# Patient Record
Sex: Female | Born: 1970 | Race: Black or African American | Hispanic: No | Marital: Married | Smoking: Never smoker
Health system: Southern US, Community
[De-identification: ages and names within clinical notes are randomized; demographics above are authoritative.]

## PROBLEM LIST (undated history)

## (undated) DIAGNOSIS — I219 Acute myocardial infarction, unspecified: Secondary | ICD-10-CM

## (undated) DIAGNOSIS — M810 Age-related osteoporosis without current pathological fracture: Secondary | ICD-10-CM

## (undated) DIAGNOSIS — H269 Unspecified cataract: Secondary | ICD-10-CM

## (undated) DIAGNOSIS — E7800 Pure hypercholesterolemia, unspecified: Secondary | ICD-10-CM

## (undated) DIAGNOSIS — N189 Chronic kidney disease, unspecified: Secondary | ICD-10-CM

## (undated) DIAGNOSIS — F32A Depression, unspecified: Secondary | ICD-10-CM

## (undated) DIAGNOSIS — M1990 Unspecified osteoarthritis, unspecified site: Secondary | ICD-10-CM

## (undated) DIAGNOSIS — G43909 Migraine, unspecified, not intractable, without status migrainosus: Secondary | ICD-10-CM

## (undated) DIAGNOSIS — G709 Myoneural disorder, unspecified: Secondary | ICD-10-CM

## (undated) DIAGNOSIS — E079 Disorder of thyroid, unspecified: Secondary | ICD-10-CM

## (undated) DIAGNOSIS — F419 Anxiety disorder, unspecified: Secondary | ICD-10-CM

## (undated) DIAGNOSIS — D649 Anemia, unspecified: Secondary | ICD-10-CM

## (undated) DIAGNOSIS — G4730 Sleep apnea, unspecified: Secondary | ICD-10-CM

## (undated) DIAGNOSIS — G4733 Obstructive sleep apnea (adult) (pediatric): Secondary | ICD-10-CM

## (undated) DIAGNOSIS — F329 Major depressive disorder, single episode, unspecified: Secondary | ICD-10-CM

## (undated) DIAGNOSIS — I10 Essential (primary) hypertension: Secondary | ICD-10-CM

## (undated) HISTORY — DX: Acute myocardial infarction, unspecified: I21.9

## (undated) HISTORY — DX: Disorder of thyroid, unspecified: E07.9

## (undated) HISTORY — DX: Sleep apnea, unspecified: G47.30

## (undated) HISTORY — PX: HERNIA REPAIR: SHX51

## (undated) HISTORY — DX: Myoneural disorder, unspecified: G70.9

## (undated) HISTORY — PX: ABDOMINAL HYSTERECTOMY: SHX81

## (undated) HISTORY — DX: Anxiety disorder, unspecified: F41.9

## (undated) HISTORY — DX: Unspecified cataract: H26.9

## (undated) HISTORY — DX: Age-related osteoporosis without current pathological fracture: M81.0

## (undated) HISTORY — DX: Anemia, unspecified: D64.9

---

## 2002-02-15 ENCOUNTER — Encounter: Payer: Self-pay | Admitting: Emergency Medicine

## 2002-02-15 ENCOUNTER — Emergency Department (HOSPITAL_COMMUNITY): Admission: EM | Admit: 2002-02-15 | Discharge: 2002-02-15 | Payer: Self-pay | Admitting: Emergency Medicine

## 2002-03-11 ENCOUNTER — Encounter: Admission: RE | Admit: 2002-03-11 | Discharge: 2002-03-22 | Payer: Self-pay | Admitting: Orthopaedic Surgery

## 2003-08-13 ENCOUNTER — Emergency Department (HOSPITAL_COMMUNITY): Admission: EM | Admit: 2003-08-13 | Discharge: 2003-08-14 | Payer: Self-pay | Admitting: Emergency Medicine

## 2004-01-27 ENCOUNTER — Encounter: Admission: RE | Admit: 2004-01-27 | Discharge: 2004-01-27 | Payer: Self-pay | Admitting: Family Medicine

## 2005-09-12 ENCOUNTER — Ambulatory Visit (HOSPITAL_COMMUNITY): Admission: RE | Admit: 2005-09-12 | Discharge: 2005-09-12 | Payer: Self-pay | Admitting: Obstetrics and Gynecology

## 2005-12-03 ENCOUNTER — Other Ambulatory Visit: Admission: RE | Admit: 2005-12-03 | Discharge: 2005-12-03 | Payer: Self-pay | Admitting: Obstetrics and Gynecology

## 2006-05-27 ENCOUNTER — Other Ambulatory Visit: Admission: RE | Admit: 2006-05-27 | Discharge: 2006-05-27 | Payer: Self-pay | Admitting: Obstetrics and Gynecology

## 2006-07-02 ENCOUNTER — Encounter (INDEPENDENT_AMBULATORY_CARE_PROVIDER_SITE_OTHER): Payer: Self-pay | Admitting: Specialist

## 2006-07-02 ENCOUNTER — Ambulatory Visit (HOSPITAL_COMMUNITY): Admission: RE | Admit: 2006-07-02 | Discharge: 2006-07-03 | Payer: Self-pay | Admitting: Obstetrics and Gynecology

## 2008-02-25 ENCOUNTER — Emergency Department (HOSPITAL_COMMUNITY): Admission: EM | Admit: 2008-02-25 | Discharge: 2008-02-25 | Payer: Self-pay | Admitting: Emergency Medicine

## 2008-08-12 ENCOUNTER — Ambulatory Visit: Payer: Self-pay | Admitting: Gynecology

## 2008-08-19 ENCOUNTER — Ambulatory Visit: Payer: Self-pay | Admitting: Gynecology

## 2008-08-31 ENCOUNTER — Ambulatory Visit: Payer: Self-pay | Admitting: Gynecology

## 2008-09-14 ENCOUNTER — Ambulatory Visit: Payer: Self-pay | Admitting: Gynecology

## 2008-09-15 ENCOUNTER — Ambulatory Visit: Payer: Self-pay | Admitting: Gynecology

## 2008-09-26 ENCOUNTER — Ambulatory Visit: Payer: Self-pay | Admitting: Gynecology

## 2010-12-22 ENCOUNTER — Encounter: Payer: Self-pay | Admitting: Family Medicine

## 2010-12-23 ENCOUNTER — Encounter: Payer: Self-pay | Admitting: Family Medicine

## 2011-04-19 NOTE — H&P (Signed)
Rhonda Gibbs, Rhonda Gibbs              ACCOUNT NO.:  [id number]   MEDICAL RECORD NO.:  [id number]          PATIENT TYPE:  AMB   LOCATION:  SDC                           [facility]ILITY:  [facility]   PHYSICIAN:  Janine Limbo, M.D.DATE OF BIRTH:  [birth year]   DATE OF ADMISSION:  07/02/2006  DATE OF DISCHARGE:                                HISTORY & PHYSICAL   HISTORY OF PRESENT ILLNESS:  The patient is a 40 year old female, para 3-0-0-  3, who presents for a vaginal hysterectomy.  She complains of  menometrorrhagia.  She has been treated in the past with oral contraceptives  including progesterone only oral contraceptives. She developed hypertension.  She had a ThermaChoice procedure performed in October 2006, but she  continues to have heavy and irregular bleeding.  An  ultrasound was  performed that showed a fibroid uterus.  She has had an endometrial sampling  done that showed benign elements. Her most recent Pap smear was in June 2007  and it was within normal limits.  Gonorrhea and Chlamydia cultures were  negative.   PAST HISTORY:  Tubal ligation.   OBSTETRIC HISTORY:  The patient has had three term vaginal deliveries.   PAST MEDICAL HISTORY:  The patient has hypertension.   DRUG ALLERGIES:  No known drug allergies.   SOCIAL HISTORY:  The patient denies cigarette use, alcohol use, and  recreational drug use.   REVIEW OF SYSTEMS:  Please see history of present illness.   FAMILY HISTORY:  The patient has a family history of hypertension.   PHYSICAL EXAMINATION:  VITAL SIGNS:  Weight is 174 pounds, height is 5 feet  5 inches.  HEENT:  Within normal limits.  CHEST:  Clear.  HEART:  Regular rate and rhythm.  BREASTS:  Without masses.  ABDOMEN:  Nontender.  EXTREMITIES:  Grossly normal.  NEUROLOGIC:  Grossly normal.  PELVIC EXAM:  External genitalia is normal. Vagina is normal. Cervix is  nontender.  The uterus is 8-10 weeks size, irregular, and firm.  Adnexa with  no masses.   Rectovaginal exam confirms.   ASSESSMENT:  1.  Fibroid uterus.  2.  Menometrorrhagia.  3.  Hypertension.   PLAN:  The patient will undergo a vaginal hysterectomy.  She understands the  indications for her surgical procedure and she accepts the risks of, but not  limited to, anesthetic complications, bleeding, infection, and possible  damage to the surrounding organs.      Janine Limbo, M.D.  Electronically Signed     AVS/MEDQ  D:  07/01/2006  T:  07/01/2006  Job:  [id number]   cc:   Leatha Gilding. Mezer, M.D.  Fax: [phone]

## 2011-04-19 NOTE — H&P (Signed)
Rhonda Gibbs, Rhonda Gibbs              ACCOUNT NO.:  [id number]   MEDICAL RECORD NO.:  [id number]          PATIENT TYPE:  AMB   LOCATION:                                [facility]  [facility]   PHYSICIAN:  Charles A. Delcambre, MDDATE OF BIRTH:  [birth year]   DATE OF ADMISSION:  09/12/2005  DATE OF DISCHARGE:  09/12/2005                                HISTORY & PHYSICAL   REASON FOR ADMISSION:  This patient is to be admitted on September 12, 2005,  to undergo dermachoice ablation for menorrhagia.   HISTORY OF PRESENT ILLNESS:  She is a 40 year old, P3-0-0-3, LMP August 20, 2005, status post tubal ligation in the past for contraception with  heavy menstrual flow with 10 pads on heavy days and up to 5 days per month  with some swelling.  She wishes to undergo ablation at this time to control  bleeding.   PAST MEDICAL HISTORY:  Hypertension.   PAST SURGICAL HISTORY:  1.  Tubal ligation.  2.  SVD x3.   MEDICATIONS:  1.  Triamterene/hydrochlorothiazide 37.5/25 once a day.  2.  Lipitor, dose not specified.   ALLERGIES:  No known drug allergies.   SOCIAL HISTORY:  Negative.   FAMILY HISTORY:  Father is 55 with hypertension, heart disease, diabetes.  Mother 41 and hypertension.  Brother 37 and hypertension.  Brother 26 and in  good health.  Otherwise, negative.   REVIEW OF SYSTEMS:  Negative for review of systems except for things noted  positive above.  She does complain of a yeast infection.   PHYSICAL EXAMINATION:  GENERAL:  Negative.  BREASTS:  Negative.  HEART:  Negative.  LUNGS:  Negative.  ABDOMEN:  Negative.  PELVIC:  Normal external genitalia, Bartholin's, urethra and Skene's glands.  Vault without bleeding noted.  A moderate amount of clumpy, white discharge  is noted.  Saline wet-prep was done.  Multiparous cervix is noted.  Pipelle  biopsy was done with consent.  No evidence of perforation was noted.  Sound  was 8-9 cm.  Dilator was used to pass the Pipelle instrument.   Tenaculum was  used tolerated well by the patient.  Uterus 8- to 9-week size, mobile,  nontender mid plane.  Adnexa nontender without masses bilaterally.  Ovaries  palpable and normal size bilaterally.   ASSESSMENT:  1.  Menorrhagia.  2.  Yeast vaginitis likely.   PLAN:  Discussed IUD versus Micronor suppression versus Provera versus  dermachoice ablation.  She elects dermachoice ablation.  Endometrial biopsy  is done today in preop.   PREPARATION:  1.  Call with results.  2.  Advise NPO past midnight before surgery next week.  3.  She gives informed consent with risks including failure of the ablation,      lack of control of bleeding during ablation, uterine perforation, damage      to bowel or bladder.  All questions were answered and we will proceed as      outlined.  4.  Preoperative CBC and quantitative HCG will be done.      Charles A. Sydnee Cabal, MD  Electronically  Signed     CAD/MEDQ  D:  09/03/2005  T:  09/03/2005  Job:  [id number]

## 2011-04-19 NOTE — Op Note (Signed)
[name], Rhonda Gibbs              ACCOUNT NO.:  [id number]   MEDICAL RECORD NO.:  [id number]          PATIENT TYPE:  AMB   LOCATION:  SDC                           [facility]   PHYSICIAN:  Charles A. Delcambre, MDDATE OF BIRTH:  01-21-1971   DATE OF PROCEDURE:  09/12/2005  DATE OF DISCHARGE:                                 OPERATIVE REPORT   PREOPERATIVE DIAGNOSIS:  Menorrhagia.   POSTOPERATIVE DIAGNOSIS:  Menorrhagia.   PROCEDURES:  1.  ThermaChoice endometrial ablation.  2.  Paracervical block.  3.  Hysteroscopy.   SURGEON:  Charles A. Sydnee Cabal, M.D.   ASSISTANT:  None.   COMPLICATIONS:  First balloon catheter/catheter error registered secondary  to catheter failure during the procedure at the time of pushing start  for heating cycle.  This immediately happened at the start of the heating  cycle.  Heating had not occurred at that time.  The pressure would not  maintain at that point.  There was no evidence of leaking from the catheter;  however, the balloon was removed and verified and was intact.  The uterus  was probed with the catheter with a curette gently.  There was no evidence  of perforation, but judgment was made that the uterus should be explored  with the hysteroscope.  The hysteroscope was used to define the integrity of  the uterus and the uterus was found to be intact without evidence of  perforation via the hysteroscope.  Using D5W, a 20 mL loss was incurred with  the hysteroscopy.  The hysteroscope was removed.   After minor dilation to insert the scope had been done, 0.25% Marcaine plain  was placed and a total of 20 mL divided at 4 and 8 o'clock for a  paracervical block during the procedure to help with postoperative pain.  The second ThermaChoice balloon was placed and held pressure nicely at 160-  180 per protocol, and a heating cycle was carried out for eight minutes  without incident.  The procedure was terminated and the patient was taken to  recovery after extubation and tolerated the procedure well.      Charles A. Sydnee Cabal, MD  Electronically Signed     CAD/MEDQ  D:  09/12/2005  T:  09/12/2005  Job:  [id number]

## 2011-04-19 NOTE — Op Note (Signed)
[name], [name]              ACCOUNT NO.:  [id number]   MEDICAL RECORD NO.:  [id number]          PATIENT TYPE:  AMB   LOCATION:  SDC                           [facility]   PHYSICIAN:  Janine Limbo, M.D.DATE OF BIRTH:  1970/12/24   DATE OF PROCEDURE:  07/02/2006  DATE OF DISCHARGE:                                 OPERATIVE REPORT   PREOPERATIVE DIAGNOSES:  1. Fibroid uterus.  2. Menometrorrhagia.   POSTOPERATIVE DIAGNOSES:  1. Fibroid uterus.  2. Menometrorrhagia.   PROCEDURE:  Vaginal hysterectomy.   SURGEON:  Janine Limbo, M.D.   FIRST ASSISTANT:  Naima A. Normand Sloop, M.D.   ANESTHETIC:  General.   DISPOSITION:  Rhonda Gibbs is a 40 year old female, para 3-0-0-3, who  presents with the above-mentioned diagnosis.  She has been treated with oral  contraceptives as well as progesterone therapy.  She has also had an  endometrial ablation performed.  She continues to have heavy and irregular  bleeding.  The patient understands the indications for her surgical  procedure and she accepts the risk of, but not limited to, anesthetic  complications, bleeding, infections, and possible damage to the surrounding  organs.   FINDINGS:  The patient had an 8-10 week size fibroid uterus.  The fallopian  tubes and ovaries appeared normal.   PROCEDURE:  The patient was taken to the operating room, where a general  anesthetic was given.  The patient's abdomen, perineum, and vagina were  prepped with multiple layers of Betadine.  The Foley catheter was placed in  the bladder.  Examination under anesthesia was performed.  The patient was  then sterilely draped.  The cervix was injected with 30 mL of a diluted  solution of Pitressin and saline.  A circumferential incision was made  around the cervix and the vaginal mucosa was advanced anteriorly and  posteriorly.  The posterior cul-de-sac was sharply entered.  The anterior  cul-de-sac was sharply entered.  Alternating from  left to right the  uterosacral ligaments, paracervical tissues, parametrial tissues, and the  uterine arteries were clamped, cut, sutured, and tied securely.  The uterus  was inverted through the posterior colpotomy.  The remainder of the upper  pedicles were then secured and the uterus was transected from the operative  field.  The uterus was sent to pathology for evaluation.  The upper pedicles  were then free tied and then suture ligated.  Hemostasis was noted to be  adequate.  The sutures attached to the uterosacral ligaments were brought  out through the vaginal angles and tied securely.  A McCall culdoplasty  suture was placed in the posterior cul-de-sac incorporating the uterosacral  ligaments bilaterally.  A final check was made for hemostasis and hemostasis  was again confirmed.  The vaginal cuff was then closed using figure-of-eight  sutures incorporating the anterior vaginal mucosa, the anterior peritoneum,  the posterior peritoneum, and the posterior vaginal mucosa.  The McCall  culdoplasty suture was tied securely and the apex of vagina was noted to  elevate into the mid pelvis.  Sponge, needle, and instrument counts were  correct  on two occasions.  The estimated blood loss for  the procedure was 100 mL.  Vicryl 0 was the suture material used throughout  the procedure.  The patient was awakened from her anesthetic without  difficulty.  She was transported to the recovery room in stable condition.  She was noted to drain clear, yellow urine at the end of her procedure.      Janine Limbo, M.D.  Electronically Signed     AVS/MEDQ  D:  07/02/2006  T:  07/02/2006  Job:  [id number]   cc:   Leatha Gilding. Mezer, M.D.  Fax: [phone]

## 2011-04-19 NOTE — Discharge Summary (Signed)
Rhonda Gibbs, Rhonda Gibbs              ACCOUNT NO.:  [id number]   MEDICAL RECORD NO.:  [id number]          PATIENT TYPE:  OIB   LOCATION:  9305                          [facility]ILITY[facility]  [facility]   PHYSICIAN:  Janine Limbo, M.D.DATE OF BIRTH:  [birth year]   DATE OF ADMISSION:  07/02/2006  DATE OF DISCHARGE:  07/03/2006                                 DISCHARGE SUMMARY   ADMISSION DIAGNOSES:  1. Fibroid uterus.  2. Menorrhagia.  3. Hypertension.   DISCHARGE DIAGNOSES:  1. Fibroid uterus.  2. Menorrhagia.  3. Hypertension.  4. Anemia secondary to surgery.   PROCEDURES THIS ADMISSION:  July 02, 2006, vaginal hysterectomy.   HISTORY OF PRESENT ILLNESS:  Ms. Rhonda Gibbs is a 40 year old female, para 3-0-0-  3, who presents for vaginal hysterectomy with the above-mentioned diagnosis.  Please see her dictated history and physical exam for details.   PHYSICAL EXAM:  The uterus was 8-10 weeks' size.   HOSPITAL COURSE:  On the day of admission, the patient had a vaginal  hysterectomy.  Operative findings included an 8-10-week size fibroid uterus.  The fallopian tubes were and ovaries were normal.  The patient tolerated her  procedure well.  The postoperative hemoglobin was 11.8 (preoperative  hemoglobin was 14.4).  The patient remained afebrile throughout her stay.  She quickly tolerated her diet and she voided well after removing her Foley.  She was discharged to home on postoperative day #1.   DISCHARGE MEDICATIONS:  1. Percocet 5/325 mg, one or two tablets every 4 hours as needed for pain.  2. Ibuprofen 600 mg every 6 hours as needed for pain.  3. Iron 325 mg twice each day for 6 weeks.  4. She will continue her preoperative medications.   DISCHARGE INSTRUCTIONS:  The patient will return to see Dr. Stefano Gaul in 6  weeks.  She will refrain from driving for 2 weeks, heavy lifting for 4  weeks, and intercourse of 6 weeks.  She will call for questions or concerns.  She was given a copy of the  postoperative instruction sheet as prepared by  the [facility] and Gynecology Division of [facility] for patients who have undergone major surgery.   Final pathology report is pending.      Janine Limbo, M.D.  Electronically Signed     AVS/MEDQ  D:  07/03/2006  T:  07/03/2006  Job:  [id number]   cc:   Leatha Gilding. Mezer, M.D.  Fax: [phone]

## 2011-08-26 LAB — POCT CARDIAC MARKERS
CKMB, poc: <1 — ABNORMAL LOW
Myoglobin, poc: 15.6
Operator id: 146091
Troponin i, poc: <0.05

## 2011-08-26 LAB — POCT I-STAT, CHEM 8
Calcium, Ion: 1.24
Glucose, Bld: 87
HCT: 47 — ABNORMAL HIGH
Hemoglobin: 16 — ABNORMAL HIGH
TCO2: 30

## 2012-08-11 ENCOUNTER — Observation Stay (HOSPITAL_BASED_OUTPATIENT_CLINIC_OR_DEPARTMENT_OTHER)
Admission: EM | Admit: 2012-08-11 | Discharge: 2012-08-12 | Disposition: A | Discharge status: Home / Self Care | Payer: Managed Care, Other (non HMO) | Attending: Emergency Medicine | Admitting: Emergency Medicine

## 2012-08-11 ENCOUNTER — Other Ambulatory Visit: Payer: Self-pay

## 2012-08-11 ENCOUNTER — Emergency Department (HOSPITAL_BASED_OUTPATIENT_CLINIC_OR_DEPARTMENT_OTHER): Payer: Managed Care, Other (non HMO)

## 2012-08-11 ENCOUNTER — Encounter (HOSPITAL_BASED_OUTPATIENT_CLINIC_OR_DEPARTMENT_OTHER): Payer: Self-pay

## 2012-08-11 DIAGNOSIS — I10 Essential (primary) hypertension: Secondary | ICD-10-CM | POA: Insufficient documentation

## 2012-08-11 DIAGNOSIS — E7800 Pure hypercholesterolemia, unspecified: Secondary | ICD-10-CM | POA: Insufficient documentation

## 2012-08-11 DIAGNOSIS — I252 Old myocardial infarction: Secondary | ICD-10-CM | POA: Insufficient documentation

## 2012-08-11 DIAGNOSIS — R079 Chest pain, unspecified: Principal | ICD-10-CM | POA: Insufficient documentation

## 2012-08-11 DIAGNOSIS — Z79899 Other long term (current) drug therapy: Secondary | ICD-10-CM | POA: Insufficient documentation

## 2012-08-11 DIAGNOSIS — R0989 Other specified symptoms and signs involving the circulatory and respiratory systems: Secondary | ICD-10-CM | POA: Insufficient documentation

## 2012-08-11 DIAGNOSIS — R0609 Other forms of dyspnea: Secondary | ICD-10-CM | POA: Insufficient documentation

## 2012-08-11 HISTORY — DX: Essential (primary) hypertension: I10

## 2012-08-11 HISTORY — DX: Pure hypercholesterolemia, unspecified: E78.00

## 2012-08-11 HISTORY — DX: Migraine, unspecified, not intractable, without status migrainosus: G43.909

## 2012-08-11 LAB — BASIC METABOLIC PANEL
CO2: 26 mEq/L (ref 19–32)
Calcium: 9.5 mg/dL (ref 8.4–10.5)
GFR calc non Af Amer: 90 mL/min — ABNORMAL LOW (ref >90)
Potassium: 3.5 mEq/L (ref 3.5–5.1)
Sodium: 136 mEq/L (ref 135–145)

## 2012-08-11 LAB — TROPONIN I
Troponin I: <0.3 ng/mL (ref <0.30)
Troponin I: <0.3 ng/mL (ref <0.30)

## 2012-08-11 LAB — CBC
MCH: 32.2 pg (ref 26.0–34.0)
MCHC: 35 g/dL (ref 30.0–36.0)
Platelets: 278 10*3/uL (ref 150–400)
RBC: 3.94 MIL/uL (ref 3.87–5.11)

## 2012-08-11 LAB — D-DIMER, QUANTITATIVE: D-Dimer, Quant: <0.27 ug/mL-FEU (ref 0.00–0.48)

## 2012-08-11 MED ORDER — NITROGLYCERIN 0.4 MG SL SUBL
0.4000 mg | SUBLINGUAL_TABLET | SUBLINGUAL | Status: DC | PRN
  Administered 2012-08-11: 0.4 mg via SUBLINGUAL
  Filled 2012-08-11: qty 25

## 2012-08-11 MED ORDER — ASPIRIN 81 MG PO CHEW
CHEWABLE_TABLET | ORAL | Status: AC
  Administered 2012-08-11: 324 mg
  Filled 2012-08-11: qty 4

## 2012-08-11 MED ORDER — ASPIRIN 325 MG PO TABS: 325.0000 mg | ORAL_TABLET | ORAL | Status: DC

## 2012-08-11 NOTE — ED Provider Notes (Signed)
Patient transferred from [facility] to CDU for chest pain protocol.  Patient in NAD upon arrival, continues to remain chest pain free.  Lungs CTA bilaterally.  S1/S2, RRR, no murmur.  Abdomen soft, bowel sounds present.  Strong distal pulses palpated all extremities.  Monitor reveals NSR without ectopy.  12 lead reviewed, no indication of ischemia.  BMI 32.3.  Coronary CT is first choice of exam, but patient does not currently have IV access with an 18 gauge catheter (22 gauge cath presently in left hand).  Nursing will attempt to find more appropriate IV site--order has been placed for stress echo in the interim.  Treatment and diagnostic plan discussed with patient.  Jimmye Norman, NP 08/11/12 [phone]

## 2012-08-11 NOTE — ED Notes (Signed)
Return from XR

## 2012-08-11 NOTE — ED Notes (Signed)
C/o CP that radiates to right arm-started last night while seated-pain worse with deep breath

## 2012-08-11 NOTE — ED Notes (Signed)
[facility] by Dr. Anitra Lauth

## 2012-08-11 NOTE — ED Notes (Signed)
Attempted to call report to CDU without answer. Will call back shortly. [facility] en route.

## 2012-08-11 NOTE — ED Notes (Signed)
[facility] arrived for transport. Pt A/O. Vitals stable. Denies CP at this time.

## 2012-08-11 NOTE — ED Provider Notes (Signed)
History     CSN: [id number]  Arrival date & time 08/11/12  1641   First MD Initiated Contact with Patient 08/11/12 1650      Chief Complaint  Patient presents with  . Chest Pain    (Consider location/radiation/quality/duration/timing/severity/associated sxs/prior treatment) Patient is a 41 y.o. female presenting with chest pain. The history is provided by the patient. No language interpreter was used.  Chest Pain The chest pain began yesterday. Duration of episode(s) is 20 minutes. Chest pain occurs intermittently. The chest pain is resolved. The pain is associated with breathing. At its most intense, the pain is at 8/10. The pain is currently at 0/10. The severity of the pain is moderate. The quality of the pain is described as aching. Chest pain is worsened by certain positions. She tried nothing for the symptoms. Risk factors include no known risk factors.  Her past medical history is significant for hypertension.     Past Medical History  Diagnosis Date  . Hypertension   . High cholesterol   . Migraine   . MI, old     Past Surgical History  Procedure Date  . Abdominal hysterectomy     No family history on file.  History  Substance Use Topics  . Smoking status: Never Smoker   . Smokeless tobacco: Not on file  . Alcohol Use: No    OB History    Grav Para Term Preterm Abortions TAB SAB Ect Mult Living                  Review of Systems  Cardiovascular: Positive for chest pain.  All other systems reviewed and are negative.    Allergies  Review of patient's allergies indicates no known allergies.  Home Medications   Current Outpatient Rx  Name Route Sig Dispense Refill  . HYDROCHLOROTHIAZIDE 25 MG PO TABS Oral Take 25 mg by mouth daily.    Marland Kitchen HYDROCODONE-ACETAMINOPHEN 5-500 MG PO TABS Oral Take 1 tablet by mouth every 6 (six) hours as needed. For pain    . LISINOPRIL 40 MG PO TABS Oral Take 40 mg by mouth daily.    . TOPIRAMATE 25 MG PO TABS Oral Take  25 mg by mouth 2 (two) times daily.      BP 133/85  Pulse 71  Temp 98.3 F (36.8 C) (Oral)  Resp 20  Ht 5\' 4"  (1.626 m)  Wt 188 lb 4.8 oz (85.412 kg)  BMI 32.32 kg/m2  SpO2 99%  Physical Exam  Nursing note and vitals reviewed. Constitutional: She is oriented to person, place, and time. She appears well-developed and well-nourished.  HENT:  Head: Normocephalic and atraumatic.  Right Ear: External ear normal.  Left Ear: External ear normal.  Nose: Nose normal.  Mouth/Throat: Oropharynx is clear and moist.  Eyes: Conjunctivae and EOM are normal. Pupils are equal, round, and reactive to light.  Neck: Normal range of motion. Neck supple.  Cardiovascular: Normal rate.   Pulmonary/Chest: Effort normal and breath sounds normal.  Abdominal: Soft.  Musculoskeletal: Normal range of motion.  Neurological: She is alert and oriented to person, place, and time.  Skin: Skin is warm.  Psychiatric: She has a normal mood and affect.    ED Course  Procedures (including critical care time)  Labs Reviewed  BASIC METABOLIC PANEL - Abnormal; Notable for the following:    Glucose, Bld 101 (*)     GFR calc non Af Amer 90 (*)     All other components within  normal limits  CBC  TROPONIN I   No results found.   No diagnosis found.    MDM   Date: 08/11/2012  Rate: 68  Rhythm: normal sinus rhythm  QRS Axis: normal  Intervals: normal  ST/T Wave abnormalities: nonspecific ST changes  Conduction Disutrbances:none  Narrative Interpretation:   Old EKG Reviewed: unchanged    Results for orders placed during the hospital encounter of 08/11/12  CBC      Component Value Range   WBC 5.9  4.0 - 10.5 K/uL   RBC 3.94  3.87 - 5.11 MIL/uL   Hemoglobin 12.7  12.0 - 15.0 g/dL   HCT 16.1  09.6 - 04.5 %   MCV 92.1  78.0 - 100.0 fL   MCH 32.2  26.0 - 34.0 pg   MCHC 35.0  30.0 - 36.0 g/dL   RDW 40.9  81.1 - 91.4 %   Platelets 278  150 - 400 K/uL  BASIC METABOLIC PANEL      Component Value  Range   Sodium 136  135 - 145 mEq/L   Potassium 3.5  3.5 - 5.1 mEq/L   Chloride 99  96 - 112 mEq/L   CO2 26  19 - 32 mEq/L   Glucose, Bld 101 (*) 70 - 99 mg/dL   BUN 10  6 - 23 mg/dL   Creatinine, Ser 7.82  0.50 - 1.10 mg/dL   Calcium 9.5  8.4 - 95.6 mg/dL   GFR calc non Af Amer 90 (*) >90 mL/min   GFR calc Af Amer >90  >90 mL/min  TROPONIN I      Component Value Range   Troponin I <0.30  <0.30 ng/mL  D-DIMER, QUANTITATIVE      Component Value Range   D-Dimer, Quant <0.27  0.00 - 0.48 ug/mL-FEU   Dg Chest 2 View  08/11/2012  *RADIOLOGY REPORT*  Clinical Data: Chest pain.  History of hypertension.  CHEST - 2 VIEW  Comparison: 02/25/2008.  Findings: Poor inspiration.  No gross change in a normal sized heart.  Clear lungs.  Mild thoracic spine degenerative changes and mild scoliosis.  IMPRESSION: No acute abnormality.   Original Report Authenticated By: Darrol Angel, M.D.     I counseled pt,   I will have her transferred to ruleout unit for further evaluation.  I spoke to Dr. Anitra Lauth and Adaline Sill NP.   Lonia Skinner [place], [place] 08/11/12 2026

## 2012-08-11 NOTE — ED Provider Notes (Signed)
Medical screening examination/treatment/procedure(s) were conducted as a shared visit with non-physician practitioner(s) and myself.  I personally evaluated the patient during the encounter  Pt with atypical chest pain, q-waves on EKG are unchanged. Send to [facility] for CDU CP Protocol.    B. Bernette Mayers, MD 08/11/12 2033

## 2012-08-12 DIAGNOSIS — R072 Precordial pain: Secondary | ICD-10-CM

## 2012-08-12 MED ORDER — ONDANSETRON 4 MG PO TBDP: ORAL_TABLET | ORAL | Status: DC

## 2012-08-12 MED ORDER — ONDANSETRON 4 MG PO TBDP
4.0000 mg | ORAL_TABLET | Freq: Once | ORAL | Status: AC
  Administered 2012-08-12: 4 mg via ORAL
  Filled 2012-08-12: qty 1

## 2012-08-12 MED ORDER — IBUPROFEN 800 MG PO TABS
800.0000 mg | ORAL_TABLET | Freq: Once | ORAL | Status: AC
  Administered 2012-08-12: 800 mg via ORAL
  Filled 2012-08-12: qty 1

## 2012-08-12 NOTE — ED Provider Notes (Signed)
7:54 AM BP 124/83  Pulse 75  Temp 98 F (36.7 C) (Oral)  Resp 15  Ht 5\' 4"  (1.626 m)  Wt 188 lb 4.8 oz (85.412 kg)  BMI 32.32 kg/m2  SpO2 100% Assumed care of the patient in CDU. Patient is here on chest pain protocol. She has opted to get the stress echocardiogram as she does not wish to have CT angiogram at this time. Due to having to get secondary IV access. She complains of headache. She also complained that she still feeling nauseated. Giving her ibuprofen 800 and Zofran 4 CV: RRR, No M/R/G, Peripheral pulses intact. No peripheral edema. Lungs: CTAB Abd: Soft, Non tender, non distended Stress echo results are normal. All discharge. The patient home with Zofran for her nausea and have her followup with the [facility], where he. She has seen her primary care.  Arthor Captain, PA-C 08/12/12 1045

## 2012-08-12 NOTE — ED Provider Notes (Signed)
Medical screening examination/treatment/procedure(s) were performed by non-physician practitioner and as supervising physician I was immediately available for consultation/collaboration.   , MD 08/12/12 1602 

## 2012-08-12 NOTE — ED Provider Notes (Signed)
Medical screening examination/treatment/procedure(s) were performed by non-physician practitioner and as supervising physician I was immediately available for consultation/collaboration.   Gwyneth Sprout, MD 08/12/12 1506

## 2012-08-12 NOTE — Progress Notes (Signed)
  Echocardiogram Echocardiogram Stress Test has been performed.  ,  08/12/2012, 9:24 AM

## 2012-08-12 NOTE — ED Notes (Signed)
D/c iv pt. Waiting on discharge papers.

## 2012-08-14 ENCOUNTER — Emergency Department (HOSPITAL_BASED_OUTPATIENT_CLINIC_OR_DEPARTMENT_OTHER)
Admission: EM | Admit: 2012-08-14 | Discharge: 2012-08-14 | Disposition: A | Discharge status: Home / Self Care | Payer: Non-veteran care | Attending: Emergency Medicine | Admitting: Emergency Medicine

## 2012-08-14 ENCOUNTER — Encounter (HOSPITAL_BASED_OUTPATIENT_CLINIC_OR_DEPARTMENT_OTHER): Payer: Self-pay | Admitting: Emergency Medicine

## 2012-08-14 DIAGNOSIS — E7800 Pure hypercholesterolemia, unspecified: Secondary | ICD-10-CM | POA: Insufficient documentation

## 2012-08-14 DIAGNOSIS — I252 Old myocardial infarction: Secondary | ICD-10-CM | POA: Insufficient documentation

## 2012-08-14 DIAGNOSIS — M436 Torticollis: Secondary | ICD-10-CM | POA: Insufficient documentation

## 2012-08-14 DIAGNOSIS — I10 Essential (primary) hypertension: Secondary | ICD-10-CM | POA: Insufficient documentation

## 2012-08-14 MED ORDER — DIAZEPAM 5 MG PO TABS: 5.0000 mg | ORAL_TABLET | Freq: Four times a day (QID) | ORAL | Status: AC | PRN

## 2012-08-14 NOTE — ED Provider Notes (Signed)
History     CSN: [id number]  Arrival date & time 08/14/12  1611   First MD Initiated Contact with Patient 08/14/12 1647      Chief Complaint  Patient presents with  . Torticollis  . Shoulder Pain  . Breast Mass    (Consider location/radiation/quality/duration/timing/severity/associated sxs/prior treatment) HPI Complains of left-sided neck pain radiating to left shoulder worse with yawning or rotating her neck onset this afternoon. Improved with remaining still Complains of left breast mass(points to left infraclavicular area) noted this afternoon. No other complaint. No treatment prior to coming here. No other associated symptoms. Past Medical History  Diagnosis Date  . Hypertension   . High cholesterol   . Migraine   . MI, old     Past Surgical History  Procedure Date  . Abdominal hysterectomy     No family history on file.  History  Substance Use Topics  . Smoking status: Never Smoker   . Smokeless tobacco: Not on file  . Alcohol Use: No    OB History    Grav Para Term Preterm Abortions TAB SAB Ect Mult Living                  Review of Systems  Constitutional: Negative.   HENT: Positive for neck pain.   Respiratory: Negative.   Cardiovascular: Negative.   Gastrointestinal: Negative.   Skin: Negative.        Breast mass  Neurological: Negative.   Hematological: Negative.   Psychiatric/Behavioral: Negative.   All other systems reviewed and are negative.    Allergies  Review of patient's allergies indicates no known allergies.  Home Medications   Current Outpatient Rx  Name Route Sig Dispense Refill  . HYDROCHLOROTHIAZIDE 25 MG PO TABS Oral Take 25 mg by mouth daily.    Marland Kitchen LISINOPRIL 40 MG PO TABS Oral Take 40 mg by mouth daily.    . TOPIRAMATE 25 MG PO TABS Oral Take 25 mg by mouth 2 (two) times daily.      BP 140/95  Pulse 98  Temp 98.1 F (36.7 C) (Oral)  Resp 16  SpO2 100%  Physical Exam  Nursing note and vitals  reviewed. Constitutional: She appears well-developed and well-nourished.  HENT:  Head: Normocephalic and atraumatic.  Eyes: Conjunctivae normal are normal. Pupils are equal, round, and reactive to light.  Neck: Neck supple. No tracheal deviation present. No thyromegaly present.       Pain over left trapezius muscle when rotating neck parietal no point tenderness  Cardiovascular: Normal rate and regular rhythm.   No murmur heard. Pulmonary/Chest: Effort normal and breath sounds normal.       Breasts and left infraclavicular area without mass no tenderness no axillary nodes nipples normal  Abdominal: Soft. Bowel sounds are normal. She exhibits no distension. There is no tenderness.  Musculoskeletal: Normal range of motion. She exhibits no edema and no tenderness.  Neurological: She is alert. Coordination normal.  Skin: Skin is warm and dry. No rash noted.  Psychiatric: She has a normal mood and affect.   correction: Pain over left trapezius muscle when rotating neck parietal. Mild associated spasm  ED Course  Procedures (including critical care time)  Labs Reviewed - No data to display No results found.   No diagnosis found.    MDM  I advised the patient that I did not appreciate any breast masses or masses on her chest she thought. She reports she had a mammogram earlier this year at the  [facility]. Plan prescription for Valium Blood pressure recheck Patient advised to go to [facility] if still feels breast mass one week. Blood pressure recheck Diagnosis #1 torticollis #2 hypertension        Doug Sou, MD 08/14/12 [phone]

## 2012-08-14 NOTE — ED Notes (Signed)
Pt c/o left sided neck pain into left shoulder and reports a "knot" in left breast. Pt states she was seen here mon for CP and spent night at [facility] for CP r/o.

## 2013-12-02 ENCOUNTER — Emergency Department (HOSPITAL_COMMUNITY): Payer: Managed Care, Other (non HMO)

## 2013-12-02 ENCOUNTER — Emergency Department (HOSPITAL_COMMUNITY)
Admission: EM | Admit: 2013-12-02 | Discharge: 2013-12-02 | Disposition: A | Discharge status: Home / Self Care | Payer: Managed Care, Other (non HMO) | Attending: Emergency Medicine | Admitting: Emergency Medicine

## 2013-12-02 DIAGNOSIS — Z79899 Other long term (current) drug therapy: Secondary | ICD-10-CM | POA: Insufficient documentation

## 2013-12-02 DIAGNOSIS — I10 Essential (primary) hypertension: Secondary | ICD-10-CM | POA: Insufficient documentation

## 2013-12-02 DIAGNOSIS — S6990XA Unspecified injury of unspecified wrist, hand and finger(s), initial encounter: Secondary | ICD-10-CM | POA: Insufficient documentation

## 2013-12-02 DIAGNOSIS — I252 Old myocardial infarction: Secondary | ICD-10-CM | POA: Insufficient documentation

## 2013-12-02 DIAGNOSIS — IMO0002 Reserved for concepts with insufficient information to code with codable children: Secondary | ICD-10-CM

## 2013-12-02 DIAGNOSIS — Z8639 Personal history of other endocrine, nutritional and metabolic disease: Secondary | ICD-10-CM | POA: Insufficient documentation

## 2013-12-02 DIAGNOSIS — S99929A Unspecified injury of unspecified foot, initial encounter: Secondary | ICD-10-CM | POA: Insufficient documentation

## 2013-12-02 DIAGNOSIS — S99919A Unspecified injury of unspecified ankle, initial encounter: Principal | ICD-10-CM

## 2013-12-02 DIAGNOSIS — G43909 Migraine, unspecified, not intractable, without status migrainosus: Secondary | ICD-10-CM | POA: Insufficient documentation

## 2013-12-02 DIAGNOSIS — S8990XA Unspecified injury of unspecified lower leg, initial encounter: Secondary | ICD-10-CM | POA: Insufficient documentation

## 2013-12-02 DIAGNOSIS — Z862 Personal history of diseases of the blood and blood-forming organs and certain disorders involving the immune mechanism: Secondary | ICD-10-CM | POA: Insufficient documentation

## 2013-12-02 DIAGNOSIS — M79641 Pain in right hand: Secondary | ICD-10-CM

## 2013-12-02 DIAGNOSIS — M549 Dorsalgia, unspecified: Secondary | ICD-10-CM

## 2013-12-02 MED ORDER — OXYCODONE-ACETAMINOPHEN 5-325 MG PO TABS
1.0000 | ORAL_TABLET | Freq: Once | ORAL | Status: DC
  Filled 2013-12-02: qty 1

## 2013-12-02 MED ORDER — OXYCODONE-ACETAMINOPHEN 5-325 MG PO TABS: 1.0000 | ORAL_TABLET | ORAL | Status: DC | PRN

## 2013-12-02 MED ORDER — TRAMADOL HCL 50 MG PO TABS
100.0000 mg | ORAL_TABLET | Freq: Once | ORAL | Status: AC
  Administered 2013-12-02: 100 mg via ORAL
  Filled 2013-12-02: qty 2

## 2013-12-02 NOTE — ED Provider Notes (Signed)
Medical screening examination/treatment/procedure(s) were performed by non-physician practitioner and as supervising physician I was immediately available for consultation/collaboration.  EKG Interpretation   None         Wandra Arthurs, MD 12/02/13 2358
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
## 2014-01-31 ENCOUNTER — Encounter: Payer: Self-pay | Admitting: Gynecology

## 2014-01-31 ENCOUNTER — Ambulatory Visit (INDEPENDENT_AMBULATORY_CARE_PROVIDER_SITE_OTHER): Payer: Managed Care, Other (non HMO) | Admitting: Gynecology

## 2014-01-31 VITALS — BP 154/96 | Ht 65.0 in | Wt 174.8 lb

## 2014-01-31 DIAGNOSIS — I10 Essential (primary) hypertension: Secondary | ICD-10-CM | POA: Insufficient documentation

## 2014-01-31 DIAGNOSIS — N898 Other specified noninflammatory disorders of vagina: Secondary | ICD-10-CM

## 2014-01-31 DIAGNOSIS — Z01419 Encounter for gynecological examination (general) (routine) without abnormal findings: Secondary | ICD-10-CM

## 2014-01-31 DIAGNOSIS — N951 Menopausal and female climacteric states: Secondary | ICD-10-CM

## 2014-01-31 DIAGNOSIS — N9489 Other specified conditions associated with female genital organs and menstrual cycle: Secondary | ICD-10-CM

## 2014-01-31 LAB — WET PREP FOR TRICH, YEAST, CLUE
CLUE CELLS WET PREP: NONE SEEN
Trich, Wet Prep: NONE SEEN
WBC, Wet Prep HPF POC: NONE SEEN
Yeast Wet Prep HPF POC: NONE SEEN

## 2014-01-31 NOTE — Progress Notes (Signed)
Rhonda Gibbs 12-20-1970 [id number]   History:    43 y.o.  for annual gyn exam was not been seen in the office over 10 years. Patient has been followed recently by her PCP and [place] where she had a recent lab work. She had been complaining of a vaginal discharge and then she went to bed and the [facility] and they put her on some antifungal agent and she went make sure that her vaginal discharge had cleared. She has had a previous abdominal hysterectomy for fibroids in menorrhagia. Patient's had the same sexual partner. She stated 2 weeks ago she had a full STD screen and all was normal. Patient also was having some occasional mood swings and irritability. She did state in her 61s when she was pregnant she had a cervical biopsy but in followup Pap smears were normal. She has history of hypertension but did not take her medication today. Her blood pressure today was 154/96.  Past medical history,surgical history, family history and social history were all reviewed and documented in the EPIC chart.  Gynecologic History No LMP recorded. Patient has had a hysterectomy. Contraception: status post hysterectomy Last Pap: 2015. Results were: normal Last mammogram: 2014. Results were: normal  Obstetric History OB History  Gravida Para Term Preterm AB SAB TAB Ectopic Multiple Living  3 3        3     # Outcome Date GA Lbr Len/2nd Weight Sex Delivery Anes PTL Lv  3 PAR           2 PAR           1 PAR                ROS: A ROS was performed and pertinent positives and negatives are included in the history.  GENERAL: No fevers or chills. HEENT: No change in vision, no earache, sore throat or sinus congestion. NECK: No pain or stiffness. CARDIOVASCULAR: No chest pain or pressure. No palpitations. PULMONARY: No shortness of breath, cough or wheeze. GASTROINTESTINAL: No abdominal pain, nausea, vomiting or diarrhea, melena or bright red blood per rectum. GENITOURINARY: No  urinary frequency, urgency, hesitancy or dysuria. MUSCULOSKELETAL: No joint or muscle pain, no back pain, no recent trauma. DERMATOLOGIC: No rash, no itching, no lesions. ENDOCRINE: No polyuria, polydipsia, no heat or cold intolerance. No recent change in weight. HEMATOLOGICAL: No anemia or easy bruising or bleeding. NEUROLOGIC: No headache, seizures, numbness, tingling or weakness. PSYCHIATRIC: No depression, no loss of interest in normal activity or change in sleep pattern.     Exam: chaperone present  BP 154/96  Ht 5\' 5"  (1.651 m)  Wt 174 lb 12.8 oz (79.289 kg)  BMI 29.09 kg/m2  Body mass index is 29.09 kg/(m^2).  General appearance : Well developed well nourished female. No acute distress HEENT: Neck supple, trachea midline, no carotid bruits, no thyroidmegaly Lungs: Clear to auscultation, no rhonchi or wheezes, or rib retractions  Heart: Regular rate and rhythm, no murmurs or gallops Breast:Examined in sitting and supine position were symmetrical in appearance, no palpable masses or tenderness,  no skin retraction, no nipple inversion, no nipple discharge, no skin discoloration, no axillary or supraclavicular lymphadenopathy Abdomen: no palpable masses or tenderness, no rebound or guarding Extremities: no edema or skin discoloration or tenderness  Pelvic:  Bartholin, Urethra, Skene Glands: Within normal limits             Vagina: No gross lesions or discharge, vaginal dryness  Cervix: No gross lesions or discharge  Uterus  absent  Adnexa  Absent  Anus and perineum  normal   Rectovaginal  normal sphincter tone without palpated masses or tenderness             Hemoccult not indicated   Wet prep negative  Assessment/Plan:  43 y.o. female for annual exam with vaginal dryness will recommend Replens or refresh to use 2-3 times a week when necessary. We will check her Blenner[place]assett and estradiol level today. She will need a mammogram in December of this year. She was reminded to take her blood  pressure medications as she gets home today. Pap smear was not done today. PCP recently did her blood work.  Note: This dictation was prepared with  Dragon/digital dictation along withSmart phrase technology. Any transcriptional errors that result from this process are unintentional.   Terrance Mass MD, 5:11 PM 01/31/2014

## 2014-01-31 NOTE — Patient Instructions (Addendum)
Perimenopause Perimenopause is the time when your body begins to move into the menopause (no menstrual period for 12 straight months). It is a natural process. Perimenopause can begin 2 8 years before the menopause and usually lasts for 1 year after the menopause. During this time, your ovaries may or may not produce an egg. The ovaries vary in their production of estrogen and progesterone hormones each month. This can cause irregular menstrual periods, difficulty getting pregnant, vaginal bleeding between periods, and uncomfortable symptoms. CAUSES  Irregular production of the ovarian hormones, estrogen and progesterone, and not ovulating every month.  Other causes include:  Tumor of the pituitary gland in the brain.  Medical disease that affects the ovaries.  Radiation treatment.  Chemotherapy.  Unknown causes.  Heavy smoking and excessive alcohol intake can bring on perimenopause sooner. SIGNS AND SYMPTOMS   Hot flashes.  Night sweats.  Irregular menstrual periods.  Decreased sex drive.  Vaginal dryness.  Headaches.  Mood swings.  Depression.  Memory problems.  Irritability.  Tiredness.  Weight gain.  Trouble getting pregnant.  The beginning of losing bone cells (osteoporosis).  The beginning of hardening of the arteries (atherosclerosis). DIAGNOSIS  Your health care provider will make a diagnosis by analyzing your age, menstrual history, and symptoms. He or she will do a physical exam and note any changes in your body, especially your female organs. Female hormone tests may or may not be helpful depending on the amount of female hormones you produce and when you produce them. However, other hormone tests may be helpful to rule out other problems. TREATMENT  In some cases, no treatment is needed. The decision on whether treatment is necessary during the perimenopause should be made by you and your health care provider based on how the symptoms are affecting you  and your lifestyle. Various treatments are available, such as:  Treating individual symptoms with a specific medicine for that symptom.  Herbal medicines that can help specific symptoms.  Counseling.  Group therapy. HOME CARE INSTRUCTIONS   Keep track of your menstrual periods (when they occur, how heavy they are, how long between periods, and how long they last) as well as your symptoms and when they started.  Only take over-the-counter or prescription medicines as directed by your health care provider.  Sleep and rest.  Exercise.  Eat a diet that contains calcium (good for your bones) and soy (acts like the estrogen hormone).  Do not smoke.  Avoid alcoholic beverages.  Take vitamin supplements as recommended by your health care provider. Taking vitamin E may help in certain cases.  Take calcium and vitamin D supplements to help prevent bone loss.  Group therapy is sometimes helpful.  Acupuncture may help in some cases. SEEK MEDICAL CARE IF:   You have questions about any symptoms you are having.  You need a referral to a specialist (gynecologist, psychiatrist, or psychologist). SEEK IMMEDIATE MEDICAL CARE IF:   You have vaginal bleeding.  Your period lasts longer than 8 days.  Your periods are recurring sooner than 21 days.  You have bleeding after intercourse.  You have severe depression.  You have pain when you urinate.  You have severe headaches.  You have vision problems. Document Released: 12/26/2004 Document Revised: 09/08/2013 Document Reviewed: 06/17/2013 [facility] Patient Information 2014 [place], [place]. Perimenopause Perimenopause is the time when your body begins to move into the menopause (no menstrual period for 12 straight months). It is a natural process. Perimenopause can begin 2 8 years before  the menopause and usually lasts for 1 year after the menopause. During this time, your ovaries may or may not produce an egg. The ovaries vary in  their production of estrogen and progesterone hormones each month. This can cause irregular menstrual periods, difficulty getting pregnant, vaginal bleeding between periods, and uncomfortable symptoms. CAUSES  Irregular production of the ovarian hormones, estrogen and progesterone, and not ovulating every month.  Other causes include:  Tumor of the pituitary gland in the brain.  Medical disease that affects the ovaries.  Radiation treatment.  Chemotherapy.  Unknown causes.  Heavy smoking and excessive alcohol intake can bring on perimenopause sooner. SIGNS AND SYMPTOMS   Hot flashes.  Night sweats.  Irregular menstrual periods.  Decreased sex drive.  Vaginal dryness.  Headaches.  Mood swings.  Depression.  Memory problems.  Irritability.  Tiredness.  Weight gain.  Trouble getting pregnant.  The beginning of losing bone cells (osteoporosis).  The beginning of hardening of the arteries (atherosclerosis). DIAGNOSIS  Your health care provider will make a diagnosis by analyzing your age, menstrual history, and symptoms. He or she will do a physical exam and note any changes in your body, especially your female organs. Female hormone tests may or may not be helpful depending on the amount of female hormones you produce and when you produce them. However, other hormone tests may be helpful to rule out other problems. TREATMENT  In some cases, no treatment is needed. The decision on whether treatment is necessary during the perimenopause should be made by you and your health care provider based on how the symptoms are affecting you and your lifestyle. Various treatments are available, such as:  Treating individual symptoms with a specific medicine for that symptom.  Herbal medicines that can help specific symptoms.  Counseling.  Group therapy. HOME CARE INSTRUCTIONS   Keep track of your menstrual periods (when they occur, how heavy they are, how long between  periods, and how long they last) as well as your symptoms and when they started.  Only take over-the-counter or prescription medicines as directed by your health care provider.  Sleep and rest.  Exercise.  Eat a diet that contains calcium (good for your bones) and soy (acts like the estrogen hormone).  Do not smoke.  Avoid alcoholic beverages.  Take vitamin supplements as recommended by your health care provider. Taking vitamin E may help in certain cases.  Take calcium and vitamin D supplements to help prevent bone loss.  Group therapy is sometimes helpful.  Acupuncture may help in some cases. SEEK MEDICAL CARE IF:   You have questions about any symptoms you are having.  You need a referral to a specialist (gynecologist, psychiatrist, or psychologist). SEEK IMMEDIATE MEDICAL CARE IF:   You have vaginal bleeding.  Your period lasts longer than 8 days.  Your periods are recurring sooner than 21 days.  You have bleeding after intercourse.  You have severe depression.  You have pain when you urinate.  You have severe headaches.  You have vision problems. Document Released: 12/26/2004 Document Revised: 09/08/2013 Document Reviewed: 06/17/2013 [facility] Patient Information 2014 [place], [place].

## 2014-02-01 LAB — FOLLICLE STIMULATING HORMONE: FSH: 6 m[IU]/mL

## 2014-02-02 ENCOUNTER — Encounter: Payer: Self-pay | Admitting: Gynecology

## 2014-02-10 ENCOUNTER — Ambulatory Visit (INDEPENDENT_AMBULATORY_CARE_PROVIDER_SITE_OTHER): Payer: Managed Care, Other (non HMO) | Admitting: Women's Health

## 2014-02-10 ENCOUNTER — Encounter: Payer: Self-pay | Admitting: Women's Health

## 2014-02-10 DIAGNOSIS — R300 Dysuria: Secondary | ICD-10-CM

## 2014-02-10 DIAGNOSIS — N390 Urinary tract infection, site not specified: Secondary | ICD-10-CM

## 2014-02-10 LAB — URINALYSIS W MICROSCOPIC + REFLEX CULTURE
BILIRUBIN URINE: NEGATIVE
CASTS: NONE SEEN
CRYSTALS: NONE SEEN
GLUCOSE, UA: NEGATIVE mg/dL
Nitrite: POSITIVE — AB
PH: 6 (ref 5.0–8.0)
UROBILINOGEN UA: 1 mg/dL (ref 0.0–1.0)

## 2014-02-10 LAB — ESTRADIOL, FREE
ESTRADIOL FREE: 1.16 pg/mL
ESTRADIOL: 75 pg/mL

## 2014-02-10 MED ORDER — SULFAMETHOXAZOLE-TRIMETHOPRIM 800-160 MG PO TABS: 1.0000 | ORAL_TABLET | Freq: Two times a day (BID) | ORAL | Status: DC

## 2014-02-10 NOTE — Progress Notes (Signed)
Patient ID: Rhonda Gibbs, female   DOB: 12-16-1970, 43 y.o.   MRN: [id number] Presents with complaint of pain at end of urination, increased urinary frequency with urgency for several days. Reports a clitoral hood piercing last week, states pain free, healing well. Denies vaginal discharge, abdominal pain or fever.  Exam: Appears well. UA: Small blood, positive nitrites, moderate leukocytes, TNTC WBCs, many bacteria.  UTI  Plan: Septra twice daily for 3 days, prescription, proper use given and reviewed. Instructed to call if no relief of urinary symptoms.

## 2014-02-11 ENCOUNTER — Ambulatory Visit: Payer: Managed Care, Other (non HMO) | Admitting: Women's Health

## 2014-02-12 LAB — URINE CULTURE: Colony Count: >=100000

## 2014-02-16 ENCOUNTER — Other Ambulatory Visit: Payer: Self-pay | Admitting: Women's Health

## 2014-02-16 MED ORDER — CIPROFLOXACIN HCL 250 MG PO TABS
250.0000 mg | ORAL_TABLET | Freq: Two times a day (BID) | ORAL | Status: DC
Start: 2014-02-16 — End: 2015-07-31

## 2014-10-03 ENCOUNTER — Encounter: Payer: Self-pay | Admitting: Women's Health

## 2015-07-31 ENCOUNTER — Other Ambulatory Visit: Payer: Self-pay | Admitting: Physician Assistant

## 2015-07-31 ENCOUNTER — Encounter (HOSPITAL_BASED_OUTPATIENT_CLINIC_OR_DEPARTMENT_OTHER): Payer: Self-pay | Admitting: *Deleted

## 2015-07-31 NOTE — H&P (Signed)
Rhonda Gibbs comes in for follow up.  This is for her left knee.  Marked continued unrelenting retropatellar symptoms.  Cannot do stairs.  All methods of treatment including Cortisone and Visco supplementation without significant improvement.  Buckling, catching and giving way.   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  History and extensive evaluation going back to November of 2014.  A conservative approach on the right eventually got things settled down there to a point that we did not proceed with operative intervention.  Unfortunately the left has been getting worse and worse and is intolerable.  Recent MRI completed on the left that shows chondral changes most marked lateral patella facet, consistent with lateral overload from lateral tracking and tethering.  There are some chondral changes in the other compartments, but well preserved joint spaces on her standing x-rays.  Her symptoms are all retropatellar.  No meniscal tears.  I have reviewed everything we have looked at and done on both knees going back to 2014.  We looked at the recent MRI of the left knee.   Remaining history and general exam is outlined and included in the chart.   EXAMINATION: Specifically, looking at both knees, she has an increased Q angle, although it is not excessive on either knee.  She has lateral tracking and crepitus, both sides, a little symptomatic and painful on the right, but exquisite on the left.  Her degree of tethering on the left is much more profound.  Neurovascularly intact distally.    DISPOSITION:  She and I are both pleased that the right knee has gotten to a point that it is tolerable and we have not had to intervene.  On the left this has not been the case.  We have discussed definitive treatment.  She would like to proceed and I thoroughly understand.  Exam under anesthesia, arthroscopy, chondroplasty and probable lateral retinacular release.   Procedure, risks, benefits and complications reviewed.  Certainly not a candidate for a Fulkerson.  Outcome is really going to depend on how tethered she is and how much we can help with a lateral release.  Also her degree of degenerative change is going to have an impact.  I covered all of this with her spending more than 25 minutes face-to-face.  I will see her at the time of operative intervention.    Rhonda Gibbs, M.D.

## 2015-07-31 NOTE — H&P (Signed)
Taetum comes in for follow up.  This is for her left knee.  Marked continued unrelenting retropatellar symptoms.  Cannot do stairs.  All methods of treatment including Cortisone and Visco supplementation without significant improvement.  Buckling, catching and giving way.   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  History and extensive evaluation going back to November of 2014.  A conservative approach on the right eventually got things settled down there to a point that we did not proceed with operative intervention.  Unfortunately the left has been getting worse and worse and is intolerable.  Recent MRI completed on the left that shows chondral changes most marked lateral patella facet, consistent with lateral overload from lateral tracking and tethering.  There are some chondral changes in the other compartments, but well preserved joint spaces on her standing x-rays.  Her symptoms are all retropatellar.  No meniscal tears.  I have reviewed everything we have looked at and done on both knees going back to 2014.  We looked at the recent MRI of the left knee.   Remaining history and general exam is outlined and included in the chart.   EXAMINATION: Specifically, looking at both knees, she has an increased Q angle, although it is not excessive on either knee.  She has lateral tracking and crepitus, both sides, a little symptomatic and painful on the right, but exquisite on the left.  Her degree of tethering on the left is much more profound.  Neurovascularly intact distally.    DISPOSITION:  She and I are both pleased that the right knee has gotten to a point that it is tolerable and we have not had to intervene.  On the left this has not been the case.  We have discussed definitive treatment.  She would like to proceed and I thoroughly understand.  Exam under anesthesia, arthroscopy, chondroplasty and probable lateral retinacular release.   Procedure, risks, benefits and complications reviewed.  Certainly not a candidate for a Fulkerson.  Outcome is really going to depend on how tethered she is and how much we can help with a lateral release.  Also her degree of degenerative change is going to have an impact.  I covered all of this with her spending more than 25 minutes face-to-face.  I will see her at the time of operative intervention.    [name], M.D.

## 2015-08-01 ENCOUNTER — Encounter (HOSPITAL_BASED_OUTPATIENT_CLINIC_OR_DEPARTMENT_OTHER)
Admission: RE | Admit: 2015-08-01 | Discharge: 2015-08-01 | Disposition: A | Discharge status: Home / Self Care | Payer: Non-veteran care | Source: Ambulatory Visit | Attending: Orthopedic Surgery | Admitting: Orthopedic Surgery

## 2015-08-01 DIAGNOSIS — M2242 Chondromalacia patellae, left knee: Secondary | ICD-10-CM | POA: Diagnosis present

## 2015-08-01 DIAGNOSIS — M25862 Other specified joint disorders, left knee: Secondary | ICD-10-CM | POA: Diagnosis not present

## 2015-08-01 DIAGNOSIS — M1712 Unilateral primary osteoarthritis, left knee: Secondary | ICD-10-CM | POA: Diagnosis not present

## 2015-08-01 LAB — BASIC METABOLIC PANEL
ANION GAP: 8 (ref 5–15)
BUN: 6 mg/dL (ref 6–20)
CALCIUM: 8.9 mg/dL (ref 8.9–10.3)
CO2: 29 mmol/L (ref 22–32)
Chloride: 98 mmol/L — ABNORMAL LOW (ref 101–111)
Creatinine, Ser: 0.84 mg/dL (ref 0.44–1.00)
Glucose, Bld: 103 mg/dL — ABNORMAL HIGH (ref 65–99)
POTASSIUM: 2.9 mmol/L — AB (ref 3.5–5.1)
Sodium: 135 mmol/L (ref 135–145)

## 2015-08-01 NOTE — Pre-Procedure Instructions (Signed)
Spoke with Dr. Amada Jupiter, notified of K+ of 2.9; he will prescribe oral K+ for pt. to start today; we will check K+ again DOS.

## 2015-08-03 ENCOUNTER — Ambulatory Visit (HOSPITAL_BASED_OUTPATIENT_CLINIC_OR_DEPARTMENT_OTHER): Payer: 59 | Admitting: Anesthesiology

## 2015-08-03 ENCOUNTER — Encounter (HOSPITAL_BASED_OUTPATIENT_CLINIC_OR_DEPARTMENT_OTHER)
Admission: RE | Disposition: A | Discharge status: Home / Self Care | Payer: Self-pay | Source: Ambulatory Visit | Attending: Orthopedic Surgery

## 2015-08-03 ENCOUNTER — Encounter (HOSPITAL_BASED_OUTPATIENT_CLINIC_OR_DEPARTMENT_OTHER): Payer: Self-pay | Admitting: *Deleted

## 2015-08-03 ENCOUNTER — Ambulatory Visit (HOSPITAL_BASED_OUTPATIENT_CLINIC_OR_DEPARTMENT_OTHER)
Admission: RE | Admit: 2015-08-03 | Discharge: 2015-08-03 | Disposition: A | Discharge status: Home / Self Care | Payer: 59 | Source: Ambulatory Visit | Attending: Orthopedic Surgery | Admitting: Orthopedic Surgery

## 2015-08-03 DIAGNOSIS — M2242 Chondromalacia patellae, left knee: Secondary | ICD-10-CM | POA: Insufficient documentation

## 2015-08-03 DIAGNOSIS — M25862 Other specified joint disorders, left knee: Secondary | ICD-10-CM | POA: Insufficient documentation

## 2015-08-03 DIAGNOSIS — M1712 Unilateral primary osteoarthritis, left knee: Secondary | ICD-10-CM | POA: Insufficient documentation

## 2015-08-03 HISTORY — PX: KNEE ARTHROSCOPY WITH LATERAL RELEASE: SHX5649

## 2015-08-03 HISTORY — PX: CHONDROPLASTY: SHX5177

## 2015-08-03 LAB — POCT I-STAT, CHEM 8
BUN: 6 mg/dL (ref 6–20)
CALCIUM ION: 1.21 mmol/L (ref 1.12–1.23)
Chloride: 102 mmol/L (ref 101–111)
Creatinine, Ser: 0.9 mg/dL (ref 0.44–1.00)
Glucose, Bld: 91 mg/dL (ref 65–99)
HEMATOCRIT: 40 % (ref 36.0–46.0)
HEMOGLOBIN: 13.6 g/dL (ref 12.0–15.0)
Potassium: 4 mmol/L (ref 3.5–5.1)
SODIUM: 138 mmol/L (ref 135–145)
TCO2: 25 mmol/L (ref 0–100)

## 2015-08-03 SURGERY — ARTHROSCOPY, KNEE, WITH LATERAL RETINACULUM RELEASE
Anesthesia: General | Site: Knee | Laterality: Left

## 2015-08-03 MED ORDER — ONDANSETRON HCL 4 MG/2ML IJ SOLN
INTRAMUSCULAR | Status: AC
  Filled 2015-08-03: qty 2

## 2015-08-03 MED ORDER — METHOCARBAMOL 1000 MG/10ML IJ SOLN: 500.0000 mg | Freq: Four times a day (QID) | INTRAMUSCULAR | Status: DC | PRN

## 2015-08-03 MED ORDER — HYDROMORPHONE HCL 1 MG/ML IJ SOLN
0.2500 mg | INTRAMUSCULAR | Status: DC | PRN
  Administered 2015-08-03 (×3): 0.5 mg via INTRAVENOUS

## 2015-08-03 MED ORDER — METOCLOPRAMIDE HCL 5 MG/ML IJ SOLN: 5.0000 mg | Freq: Three times a day (TID) | INTRAMUSCULAR | Status: DC | PRN

## 2015-08-03 MED ORDER — PROPOFOL 10 MG/ML IV BOLUS
INTRAVENOUS | Status: AC
  Filled 2015-08-03: qty 20

## 2015-08-03 MED ORDER — FENTANYL CITRATE (PF) 100 MCG/2ML IJ SOLN
INTRAMUSCULAR | Status: AC
  Filled 2015-08-03: qty 4

## 2015-08-03 MED ORDER — CEFAZOLIN SODIUM-DEXTROSE 2-3 GM-% IV SOLR
2.0000 g | INTRAVENOUS | Status: AC
  Administered 2015-08-03: 2 g via INTRAVENOUS

## 2015-08-03 MED ORDER — GLYCOPYRROLATE 0.2 MG/ML IJ SOLN: 0.2000 mg | Freq: Once | INTRAMUSCULAR | Status: DC | PRN

## 2015-08-03 MED ORDER — OXYCODONE-ACETAMINOPHEN 5-325 MG PO TABS
1.0000 | ORAL_TABLET | ORAL | Status: DC | PRN
  Administered 2015-08-03: 1 via ORAL

## 2015-08-03 MED ORDER — KETOROLAC TROMETHAMINE 30 MG/ML IJ SOLN
INTRAMUSCULAR | Status: DC | PRN
Start: 2015-08-03 — End: 2015-08-03
  Administered 2015-08-03: 30 mg via INTRAVENOUS

## 2015-08-03 MED ORDER — ONDANSETRON HCL 4 MG PO TABS: 4.0000 mg | ORAL_TABLET | Freq: Four times a day (QID) | ORAL | Status: DC | PRN

## 2015-08-03 MED ORDER — FENTANYL CITRATE (PF) 100 MCG/2ML IJ SOLN
50.0000 ug | INTRAMUSCULAR | Status: DC | PRN
  Administered 2015-08-03: 100 ug via INTRAVENOUS

## 2015-08-03 MED ORDER — ONDANSETRON HCL 4 MG/2ML IJ SOLN
INTRAMUSCULAR | Status: DC | PRN
Start: 2015-08-03 — End: 2015-08-03
  Administered 2015-08-03: 4 mg via INTRAVENOUS

## 2015-08-03 MED ORDER — METHYLPREDNISOLONE ACETATE 80 MG/ML IJ SUSP
INTRAMUSCULAR | Status: DC | PRN
  Administered 2015-08-03: 80 mg via INTRA_ARTICULAR

## 2015-08-03 MED ORDER — HYDROMORPHONE HCL 1 MG/ML IJ SOLN
INTRAMUSCULAR | Status: AC
  Filled 2015-08-03: qty 1

## 2015-08-03 MED ORDER — SODIUM CHLORIDE 0.9 % IR SOLN
Status: DC | PRN
  Administered 2015-08-03: 4500 mL

## 2015-08-03 MED ORDER — CEFAZOLIN SODIUM-DEXTROSE 2-3 GM-% IV SOLR
INTRAVENOUS | Status: AC
  Filled 2015-08-03: qty 50

## 2015-08-03 MED ORDER — HYDROMORPHONE HCL 1 MG/ML IJ SOLN: 0.5000 mg | INTRAMUSCULAR | Status: DC | PRN

## 2015-08-03 MED ORDER — LACTATED RINGERS IV SOLN
INTRAVENOUS | Status: DC
Start: 2015-08-03 — End: 2015-08-03
  Administered 2015-08-03: 12:00:00 via INTRAVENOUS

## 2015-08-03 MED ORDER — METHOCARBAMOL 500 MG PO TABS: 500.0000 mg | ORAL_TABLET | Freq: Four times a day (QID) | ORAL | Status: DC | PRN

## 2015-08-03 MED ORDER — ONDANSETRON HCL 4 MG/2ML IJ SOLN: 4.0000 mg | Freq: Four times a day (QID) | INTRAMUSCULAR | Status: DC | PRN

## 2015-08-03 MED ORDER — LIDOCAINE HCL (CARDIAC) 20 MG/ML IV SOLN
INTRAVENOUS | Status: DC | PRN
  Administered 2015-08-03: 100 mg via INTRAVENOUS

## 2015-08-03 MED ORDER — MIDAZOLAM HCL 2 MG/2ML IJ SOLN
INTRAMUSCULAR | Status: AC
  Filled 2015-08-03: qty 4

## 2015-08-03 MED ORDER — PROPOFOL 10 MG/ML IV BOLUS
INTRAVENOUS | Status: DC | PRN
Start: 2015-08-03 — End: 2015-08-03
  Administered 2015-08-03: 200 mg via INTRAVENOUS

## 2015-08-03 MED ORDER — MIDAZOLAM HCL 2 MG/2ML IJ SOLN
1.0000 mg | INTRAMUSCULAR | Status: DC | PRN
  Administered 2015-08-03: 2 mg via INTRAVENOUS

## 2015-08-03 MED ORDER — CHLORHEXIDINE GLUCONATE 4 % EX LIQD: 60.0000 mL | Freq: Once | CUTANEOUS | Status: DC

## 2015-08-03 MED ORDER — BUPIVACAINE HCL (PF) 0.5 % IJ SOLN
INTRAMUSCULAR | Status: DC | PRN
  Administered 2015-08-03: 20 mL

## 2015-08-03 MED ORDER — OXYCODONE-ACETAMINOPHEN 5-325 MG PO TABS
ORAL_TABLET | ORAL | Status: AC
  Filled 2015-08-03: qty 1

## 2015-08-03 MED ORDER — KETOROLAC TROMETHAMINE 30 MG/ML IJ SOLN
INTRAMUSCULAR | Status: AC
  Filled 2015-08-03: qty 1

## 2015-08-03 MED ORDER — BUPIVACAINE HCL (PF) 0.5 % IJ SOLN
INTRAMUSCULAR | Status: AC
  Filled 2015-08-03: qty 30

## 2015-08-03 MED ORDER — METOCLOPRAMIDE HCL 5 MG PO TABS: 5.0000 mg | ORAL_TABLET | Freq: Three times a day (TID) | ORAL | Status: DC | PRN

## 2015-08-03 MED ORDER — SCOPOLAMINE 1 MG/3DAYS TD PT72: 1.0000 | MEDICATED_PATCH | Freq: Once | TRANSDERMAL | Status: DC | PRN

## 2015-08-03 MED ORDER — LACTATED RINGERS IV SOLN
INTRAVENOUS | Status: DC
  Administered 2015-08-03: 13:00:00 via INTRAVENOUS

## 2015-08-03 MED ORDER — DEXAMETHASONE SODIUM PHOSPHATE 10 MG/ML IJ SOLN
INTRAMUSCULAR | Status: AC
  Filled 2015-08-03: qty 1

## 2015-08-03 SURGICAL SUPPLY — 39 items
BANDAGE ELASTIC 6 VELCRO ST LF (GAUZE/BANDAGES/DRESSINGS) ×2 IMPLANT
BLADE CUDA 5.5 (BLADE) IMPLANT
BLADE CUDA GRT WHITE 3.5 (BLADE) IMPLANT
BLADE CUTTER GATOR 3.5 (BLADE) ×2 IMPLANT
BLADE CUTTER MENIS 5.5 (BLADE) IMPLANT
BLADE GREAT WHITE 4.2 (BLADE) ×2 IMPLANT
BUR OVAL 4.0 (BURR) IMPLANT
CUTTER MENISCUS  4.2MM (BLADE)
CUTTER MENISCUS 4.2MM (BLADE) IMPLANT
DRAPE ARTHROSCOPY W/POUCH 90 (DRAPES) ×2 IMPLANT
DURAPREP 26ML APPLICATOR (WOUND CARE) ×2 IMPLANT
ELECT MENISCUS 165MM 90D (ELECTRODE) ×2 IMPLANT
ELECT REM PT RETURN 9FT ADLT (ELECTROSURGICAL)
ELECTRODE REM PT RTRN 9FT ADLT (ELECTROSURGICAL) IMPLANT
GAUZE SPONGE 4X4 12PLY STRL (GAUZE/BANDAGES/DRESSINGS) ×4 IMPLANT
GAUZE XEROFORM 1X8 LF (GAUZE/BANDAGES/DRESSINGS) ×2 IMPLANT
GLOVE BIOGEL PI IND STRL 7.0 (GLOVE) ×2 IMPLANT
GLOVE BIOGEL PI INDICATOR 7.0 (GLOVE) ×2
GLOVE ECLIPSE 6.5 STRL STRAW (GLOVE) ×2 IMPLANT
GLOVE ECLIPSE 7.0 STRL STRAW (GLOVE) ×2 IMPLANT
GLOVE ORTHO TXT STRL SZ7.5 (GLOVE) IMPLANT
GLOVE SURG ORTHO 8.0 STRL STRW (GLOVE) ×2 IMPLANT
GOWN STRL REUS W/ TWL LRG LVL3 (GOWN DISPOSABLE) ×2 IMPLANT
GOWN STRL REUS W/ TWL XL LVL3 (GOWN DISPOSABLE) ×1 IMPLANT
GOWN STRL REUS W/TWL LRG LVL3 (GOWN DISPOSABLE) ×4
GOWN STRL REUS W/TWL XL LVL3 (GOWN DISPOSABLE) ×2
HOLDER KNEE FOAM BLUE (MISCELLANEOUS) ×2 IMPLANT
IV NS IRRIG 3000ML ARTHROMATIC (IV SOLUTION) ×4 IMPLANT
KNEE WRAP E Z 3 GEL PACK (MISCELLANEOUS) ×2 IMPLANT
MANIFOLD NEPTUNE II (INSTRUMENTS) ×2 IMPLANT
PACK ARTHROSCOPY DSU (CUSTOM PROCEDURE TRAY) ×2 IMPLANT
PACK BASIN DAY SURGERY FS (CUSTOM PROCEDURE TRAY) ×2 IMPLANT
PENCIL BUTTON HOLSTER BLD 10FT (ELECTRODE) ×2 IMPLANT
SET ARTHROSCOPY TUBING (MISCELLANEOUS) ×2
SET ARTHROSCOPY TUBING LN (MISCELLANEOUS) ×1 IMPLANT
SUT ETHILON 3 0 PS 1 (SUTURE) ×2 IMPLANT
SUT VIC AB 3-0 FS2 27 (SUTURE) IMPLANT
TOWEL OR 17X24 6PK STRL BLUE (TOWEL DISPOSABLE) ×2 IMPLANT
WATER STERILE IRR 1000ML POUR (IV SOLUTION) ×2 IMPLANT

## 2015-08-03 NOTE — Discharge Instructions (Signed)

## 2015-08-03 NOTE — Transfer of Care (Signed)
Immediate Anesthesia Transfer of Care Note  Patient: Rhonda Gibbs  Procedure(s) Performed: Procedure(s): LEFT KNEE ARTHROSCOPY  CHONDROPLASTY WITH LATERAL RELEASE (Left) CHONDROPLASTY (Left)  Patient Location: PACU  Anesthesia Type:General  Level of Consciousness: awake and sedated  Airway & Oxygen Therapy: Patient Spontanous Breathing and Patient connected to face mask oxygen  Post-op Assessment: Report given to RN and Post -op Vital signs reviewed and stable  Post vital signs: Reviewed and stable  Last Vitals:  Filed Vitals:   08/03/15 1355  BP:   Pulse: 76  Temp:   Resp: 19    Complications: No apparent anesthesia complications

## 2015-08-03 NOTE — H&P (View-Only) (Signed)
Rhonda Gibbs comes in for follow up.  This is for her left knee.  Marked continued unrelenting retropatellar symptoms.  Cannot do stairs.  All methods of treatment including Cortisone and Visco supplementation without significant improvement.  Buckling, catching and giving way.   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  History and extensive evaluation going back to November of 2014.  A conservative approach on the right eventually got things settled down there to a point that we did not proceed with operative intervention.  Unfortunately the left has been getting worse and worse and is intolerable.  Recent MRI completed on the left that shows chondral changes most marked lateral patella facet, consistent with lateral overload from lateral tracking and tethering.  There are some chondral changes in the other compartments, but well preserved joint spaces on her standing x-rays.  Her symptoms are all retropatellar.  No meniscal tears.  I have reviewed everything we have looked at and done on both knees going back to 2014.  We looked at the recent MRI of the left knee.   Remaining history and general exam is outlined and included in the chart.   EXAMINATION: Specifically, looking at both knees, she has an increased Q angle, although it is not excessive on either knee.  She has lateral tracking and crepitus, both sides, a little symptomatic and painful on the right, but exquisite on the left.  Her degree of tethering on the left is much more profound.  Neurovascularly intact distally.    DISPOSITION:  She and I are both pleased that the right knee has gotten to a point that it is tolerable and we have not had to intervene.  On the left this has not been the case.  We have discussed definitive treatment.  She would like to proceed and I thoroughly understand.  Exam under anesthesia, arthroscopy, chondroplasty and probable lateral retinacular release.   Procedure, risks, benefits and complications reviewed.  Certainly not a candidate for a Fulkerson.  Outcome is really going to depend on how tethered she is and how much we can help with a lateral release.  Also her degree of degenerative change is going to have an impact.  I covered all of this with her spending more than 25 minutes face-to-face.  I will see her at the time of operative intervention.    Daniel F. Murphy, M.D.    

## 2015-08-03 NOTE — Anesthesia Procedure Notes (Signed)
Procedure Name: LMA Insertion Date/Time: 08/03/2015 1:18 PM Performed by: Maryella Shivers Pre-anesthesia Checklist: Patient identified, Emergency Drugs available, Suction available and Patient being monitored Patient Re-evaluated:Patient Re-evaluated prior to inductionOxygen Delivery Method: Circle System Utilized Preoxygenation: Pre-oxygenation with 100% oxygen Intubation Type: IV induction Ventilation: Mask ventilation without difficulty LMA: LMA inserted LMA Size: 4.0 Number of attempts: 1 Airway Equipment and Method: Bite block Placement Confirmation: positive ETCO2 Tube secured with: Tape Dental Injury: Teeth and Oropharynx as per pre-operative assessment

## 2015-08-03 NOTE — Interval H&P Note (Signed)
History and Physical Interval Note:  08/03/2015 7:31 AM  Rhonda Gibbs  has presented today for surgery, with the diagnosis of UNILATERAL PRIMARY OSTEOARTHRITIS LEFT KNEE CHONDROMALACIA PATELLAE  The various methods of treatment have been discussed with the patient and family. After consideration of risks, benefits and other options for treatment, the patient has consented to  Procedure(s): LEFT KNEE ARTHROSCOPY  CHONDRO WITH LATERAL RELEASE (Left) as a surgical intervention .  The patient's history has been reviewed, patient examined, no change in status, stable for surgery.  I have reviewed the patient's chart and labs.  Questions were answered to the patient's satisfaction.     [name]

## 2015-08-04 ENCOUNTER — Encounter (HOSPITAL_BASED_OUTPATIENT_CLINIC_OR_DEPARTMENT_OTHER): Payer: Self-pay | Admitting: Orthopedic Surgery

## 2015-08-04 NOTE — Anesthesia Preprocedure Evaluation (Addendum)
Anesthesia Evaluation  Patient identified by MRN, date of birth, ID band Patient awake    Reviewed: Allergy & Precautions, NPO status , Patient's Chart, lab work & pertinent test results  Airway Mallampati: II  TM Distance: >3 FB Neck ROM: Full    Dental   Pulmonary sleep apnea ,  breath sounds clear to auscultation        Cardiovascular hypertension, Rhythm:Regular Rate:Normal     Neuro/Psych    GI/Hepatic negative GI ROS, Neg liver ROS,   Endo/Other  negative endocrine ROS  Renal/GU negative Renal ROS     Musculoskeletal   Abdominal   Peds  Hematology   Anesthesia Other Findings   Reproductive/Obstetrics                             Anesthesia Physical Anesthesia Plan  ASA: III  Anesthesia Plan: General   Post-op Pain Management:    Induction: Intravenous  Airway Management Planned: Oral ETT  Additional Equipment:   Intra-op Plan:   Post-operative Plan: Extubation in OR  Informed Consent: I have reviewed the patients History and Physical, chart, labs and discussed the procedure including the risks, benefits and alternatives for the proposed anesthesia with the patient or authorized representative who has indicated his/her understanding and acceptance.   Dental advisory given  Plan Discussed with: CRNA and Anesthesiologist  Anesthesia Plan Comments:        Anesthesia Quick Evaluation

## 2015-08-04 NOTE — Op Note (Signed)
Rhonda Gibbs, Rhonda Gibbs             ACCOUNT NO.:  [id number]  MEDICAL RECORD NO.:  [id number]  LOCATION:                               FACILITY:  [facility]  PHYSICIAN:  [name], M.D. DATE OF BIRTH:  01/01/1971  DATE OF PROCEDURE:  08/03/2015 DATE OF DISCHARGE:  08/03/2015                              OPERATIVE REPORT   PREOPERATIVE DIAGNOSIS:  Left knee chondromalacia of patella, medial femoral condyle with lateral patellofemoral tracking and tethering.  POSTOPERATIVE DIAGNOSES:  Left knee chondromalacia of patella, medial femoral condyle with lateral patellofemoral tracking and tethering with grade 3 changes of lateral patella and much of the weightbearing dome, medial femoral condyle with chondral debris.  PROCEDURES:  Left knee exam under anesthesia, arthroscopy. Chondroplasty of patella and medial femoral condyle.  Arthroscopic lateral retinacular release.  SURGEON:  [name], M.D.  ASSISTANT:  Elmyra Ricks, PA  ANESTHESIA:  General.  BLOOD LOSS:  Minimal.  SPECIMENS:  None.  CULTURES:  None.  COMPLICATION:  None.  DRESSINGS:  Soft compressive with lateral bolster.  DESCRIPTION OF PROCEDURE:  The patient was brought to the operating room and placed on the operating table in supine position.  After adequate anesthesia had been obtained, tourniquet and leg holder applied.  Leg was prepped and draped in usual sterile fashion.  Two portals; one each medial and lateral parapatellar.  Arthroscope was introduced.  Knee was distended and inspected.  Confirmed lateral tracking and tethering. Some grade 3 changes at the lateral patella with grade 2 in the central trochlea, debrided.  Diffuse grade 3 changes of weightbearing dome, medial and femoral condyle, debrided, nothing full-thickness.  Menisci intact.  Cruciate ligaments were intact.  After chondroplasty looking for the medial side, this was an arthroscopic lateral release from the vastus lateralis  superiorly down the lateral joint line inferiorly.  Marked improvement and tethering.  Portals were closed with nylon.  Knee was injected with Marcaine.  Sterile compressive dressing applied with lateral bolster. Anesthesia was reversed.  Brought to the recovery room.  Tolerated the surgery well.  No complications.     [name], M.D.     DFM/MEDQ  D:  08/03/2015  T:  08/04/2015  Job:  [id number]

## 2015-08-04 NOTE — Anesthesia Postprocedure Evaluation (Signed)
  Anesthesia Post-op Note  Patient: Rhonda Gibbs  Procedure(s) Performed: Procedure(s): LEFT KNEE ARTHROSCOPY  CHONDROPLASTY WITH LATERAL RELEASE (Left) CHONDROPLASTY (Left)  Patient Location: PACU  Anesthesia Type:General  Level of Consciousness: awake  Airway and Oxygen Therapy: Patient Spontanous Breathing  Post-op Pain: mild  Post-op Assessment: Post-op Vital signs reviewed LLE Motor Response: Purposeful movement LLE Sensation: Full sensation          Post-op Vital Signs: Reviewed  Last Vitals:  Filed Vitals:   08/03/15 1540  BP: 134/79  Pulse: 67  Temp: 36.7 C  Resp: 18    Complications: No apparent anesthesia complications

## 2016-05-30 ENCOUNTER — Ambulatory Visit: Payer: Managed Care, Other (non HMO) | Admitting: Gynecology

## 2016-06-05 ENCOUNTER — Encounter: Payer: Self-pay | Admitting: Gynecology

## 2016-06-05 ENCOUNTER — Ambulatory Visit (INDEPENDENT_AMBULATORY_CARE_PROVIDER_SITE_OTHER): Payer: Commercial Managed Care - HMO | Admitting: Gynecology

## 2016-06-05 VITALS — BP 120/82

## 2016-06-05 DIAGNOSIS — N83202 Unspecified ovarian cyst, left side: Secondary | ICD-10-CM | POA: Diagnosis not present

## 2016-06-05 DIAGNOSIS — A499 Bacterial infection, unspecified: Secondary | ICD-10-CM

## 2016-06-05 DIAGNOSIS — B9689 Other specified bacterial agents as the cause of diseases classified elsewhere: Secondary | ICD-10-CM

## 2016-06-05 DIAGNOSIS — N760 Acute vaginitis: Secondary | ICD-10-CM

## 2016-06-05 DIAGNOSIS — N898 Other specified noninflammatory disorders of vagina: Secondary | ICD-10-CM

## 2016-06-05 LAB — WET PREP FOR TRICH, YEAST, CLUE
TRICH WET PREP: NONE SEEN
YEAST WET PREP: NONE SEEN

## 2016-06-05 NOTE — Progress Notes (Addendum)
HPI: Patient is a 45 year old was not been seen the office in 2 years. She states she had her gynecological examination this year and brought her basic metabolic panel from the [facility] in [facility]. She stated that 2 months ago she was treated for bacterial vaginosis with Flagyl for one week and then last month she saw another provider hearing [facility] with vaginal discharge again and was treated for BV with clindamycin for one week. Patient has had a steady sexual partner. She has a past history of abdominal hysterectomy for fibroids and menorrhagia. She is in the process of having a total left knee replacement in a this month and wanted make sure she didn't have any persistent bacterial vaginosis. She denied any fever, chills, nausea, vomiting or any back pain. No GI complaints. No change in appetite and no change in weight. Patient states that she does not douche or uses any form bodies of any lubricants intravaginally.  In addition to her labs she brought a note that was sent to her by her primary care position at the [facility] in reference to a recent ultrasound stating that a previously seen right ovarian cyst had resolved that she had a new cyst on the left ovary and was scheduled for follow-up ultrasound in 3 months later this month.   ROS: A ROS was performed and pertinent positives and negatives are included in the history.  GENERAL: No fevers or chills. HEENT: No change in vision, no earache, sore throat or sinus congestion. NECK: No pain or stiffness. CARDIOVASCULAR: No chest pain or pressure. No palpitations. PULMONARY: No shortness of breath, cough or wheeze. GASTROINTESTINAL: No abdominal pain, nausea, vomiting or diarrhea, melena or bright red blood per rectum. GENITOURINARY: No urinary frequency, urgency, hesitancy or dysuria. MUSCULOSKELETAL: No joint or muscle pain, no back pain, no recent trauma. DERMATOLOGIC: No rash, no itching, no lesions. ENDOCRINE: No polyuria,  polydipsia, no heat or cold intolerance. No recent change in weight. HEMATOLOGICAL: No anemia or easy bruising or bleeding. NEUROLOGIC: No headache, seizures, numbness, tingling or weakness. PSYCHIATRIC: No depression, no loss of interest in normal activity or change in sleep pattern.   PE: Blood pressure 120/82 Gen. 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 She is going to be treated with boric acid 600 mg suppository to apply intravaginally for 21 days. For long-term management of vaginal pH balance I have recommended 2 options as follows: Refresh probiotic tablet she can take orally every day and use refresh probiotic gel after intercourse or twice a week.  Patient was reminded to follow-up with the [facility] physician that had seen her in following up on her left ovarian cyst for her ultrasound.    Greater than 50% of time was spent in counseling and coordinating care of this patient.   Time of consultation: 15   Minutes.

## 2016-06-05 NOTE — Patient Instructions (Signed)
Rephresh Probiotic tablet take one daily to balance vaginl pH and reduce recurrence of BV and or use Rehphresh Probiotic Jell after intercourse. You can buy this over the counter without prescription.  Bacterial Vaginosis Bacterial vaginosis is a vaginal infection that occurs when the normal balance of bacteria in the vagina is disrupted. It results from an overgrowth of certain bacteria. This is the most common vaginal infection in women of childbearing age. Treatment is important to prevent complications, especially in pregnant women, as it can cause a premature delivery. CAUSES  Bacterial vaginosis is caused by an increase in harmful bacteria that are normally present in smaller amounts in the vagina. Several different kinds of bacteria can cause bacterial vaginosis. However, the reason that the condition develops is not fully understood. RISK FACTORS Certain activities or behaviors can put you at an increased risk of developing bacterial vaginosis, including:  Having a new sex partner or multiple sex partners.  Douching.  Using an intrauterine device (IUD) for contraception. Women do not get bacterial vaginosis from toilet seats, bedding, swimming pools, or contact with objects around them. SIGNS AND SYMPTOMS  Some women with bacterial vaginosis have no signs or symptoms. Common symptoms include:  Grey vaginal discharge.  A fishlike odor with discharge, especially after sexual intercourse.  Itching or burning of the vagina and vulva.  Burning or pain with urination. DIAGNOSIS  Your health care provider will take a medical history and examine the vagina for signs of bacterial vaginosis. A sample of vaginal fluid may be taken. Your health care provider will look at this sample under a microscope to check for bacteria and abnormal cells. A vaginal pH test may also be done.  TREATMENT  Bacterial vaginosis may be treated with antibiotic medicines. These may be given in the form of a pill  or a vaginal cream. A second round of antibiotics may be prescribed if the condition comes back after treatment. Because bacterial vaginosis increases your risk for sexually transmitted diseases, getting treated can help reduce your risk for chlamydia, gonorrhea, HIV, and herpes. HOME CARE INSTRUCTIONS   Only take over-the-counter or prescription medicines as directed by your health care provider.  If antibiotic medicine was prescribed, take it as directed. Make sure you finish it even if you start to feel better.  Tell all sexual partners that you have a vaginal infection. They should see their health care provider and be treated if they have problems, such as a mild rash or itching.  During treatment, it is important that you follow these instructions:  Avoid sexual activity or use condoms correctly.  Do not douche.  Avoid alcohol as directed by your health care provider.  Avoid breastfeeding as directed by your health care provider. SEEK MEDICAL CARE IF:   Your symptoms are not improving after 3 days of treatment.  You have increased discharge or pain.  You have a fever. MAKE SURE YOU:   Understand these instructions.  Will watch your condition.  Will get help right away if you are not doing well or get worse. FOR MORE INFORMATION  Centers for Disease Control and Prevention, Division of STD Prevention: [employer]raud[employer] American Sexual Health Association (ASHA): www.ashastd.org    This information is not intended to replace advice given to you by your health care provider. Make sure you discuss any questions you have with your health care provider.   Document Released: 11/18/2005 Document Revised: 12/09/2014 Document Reviewed: 06/30/2013 Elsevier Interactive Patient Education [employer].

## 2016-06-11 ENCOUNTER — Other Ambulatory Visit: Payer: Self-pay | Admitting: Physician Assistant

## 2016-06-11 NOTE — H&P (Signed)
TOTAL KNEE ADMISSION H&P  Patient is being admitted for left total knee arthroplasty.  Subjective:  Chief Complaint:left knee pain.  HPI: Rhonda Gibbs, 45 y.o. female, has a history of pain and functional disability in the left knee due to arthritis and has failed non-surgical conservative treatments for greater than 12 weeks to includecorticosteriod injections, viscosupplementation injections and use of assistive devices.  Onset of symptoms was gradual, starting >10 years ago with rapidlly worsening course since that time. The patient noted prior procedures on the knee to include  arthroscopy and menisectomy on the left knee(s).  Patient currently rates pain in the left knee(s) at 10 out of 10 with activity. Patient has night pain, worsening of pain with activity and weight bearing, pain that interferes with activities of daily living and crepitus.  Patient has evidence of subchondral sclerosis and joint space narrowing by imaging studies. There is no active infection.  Patient Active Problem List   Diagnosis Date Noted  . HTN (hypertension) 01/31/2014   Past Medical History  Diagnosis Date  . Hypertension   . High cholesterol   . Migraine   . OSA on CPAP     Past Surgical History  Procedure Laterality Date  . Abdominal hysterectomy    . Knee arthroscopy with lateral release Left 08/03/2015    Procedure: LEFT KNEE ARTHROSCOPY  CHONDROPLASTY WITH LATERAL RELEASE;  Surgeon: [name], MD;  Location: [place];  Service: Orthopedics;  Laterality: Left;  . Chondroplasty Left 08/03/2015    Procedure: CHONDROPLASTY;  Surgeon: [name], MD;  Location: [place];  Service: Orthopedics;  Laterality: Left;     (Not in a hospital admission) No Known Allergies  Social History  Substance Use Topics  . Smoking status: Never Smoker   . Smokeless tobacco: Not on file  . Alcohol Use: Yes     Comment: wine    Family History  Problem Relation Age of  Onset  . Hypertension Mother   . Diabetes Father   . Hypertension Father   . Heart disease Father     chf  . Hypertension Brother   . Cancer Maternal Grandfather     colon  . Cancer Paternal Grandmother     cervical or colon????  . Cancer Cousin     prostate     Review of Systems  Constitutional: Negative.   HENT: Negative.   Eyes: Negative.   Respiratory: Negative.   Cardiovascular: Negative.   Gastrointestinal: Negative.   Genitourinary: Negative.   Musculoskeletal: Positive for joint pain.  Skin: Negative.   Neurological: Negative.   Endo/Heme/Allergies: Negative.   Psychiatric/Behavioral: Negative.     Objective:  Physical Exam  Constitutional: She is oriented to person, place, and time. She appears well-developed and well-nourished.  Eyes: EOM are normal. Pupils are equal, round, and reactive to light.  Neck: Normal range of motion. Neck supple.  Respiratory: Effort normal and breath sounds normal.  GI: Soft. Bowel sounds are normal.  Musculoskeletal:  Specifically, she is wearing a brace all the time on her left knee.  She is getting weaker and stiffer.  I still can get full extension and 90 degrees of flexion, but she is getting more and more quad atrophy.    Neurological: She is alert and oriented to person, place, and time.  Skin: Skin is warm and dry.  Psychiatric: She has a normal mood and affect. Her behavior is normal. Judgment and thought content normal.  Vital signs in last 24 hours: @VSRANGES @  Labs:   Estimated body mass index is 29.12 kg/(m^2) as calculated from the following:   Height as of 08/03/15: 5\' 5"  (1.651 m).   Weight as of 08/03/15: 79.379 kg (175 lb).   Imaging Review Plain radiographs demonstrate severe degenerative joint disease of the left knee(s). The overall alignment isneutral. The bone quality appears to be fair for age and reported activity level.  Assessment/Plan:  End stage arthritis, left knee   The patient history,  physical examination, clinical judgment of the provider and imaging studies are consistent with end stage degenerative joint disease of the left knee(s) and total knee arthroplasty is deemed medically necessary. The treatment options including medical management, injection therapy arthroscopy and arthroplasty were discussed at length. The risks and benefits of total knee arthroplasty were presented and reviewed. The risks due to aseptic loosening, infection, stiffness, patella tracking problems, thromboembolic complications and other imponderables were discussed. The patient acknowledged the explanation, agreed to proceed with the plan and consent was signed. Patient is being admitted for inpatient treatment for surgery, pain control, PT, OT, prophylactic antibiotics, VTE prophylaxis, progressive ambulation and ADL's and discharge planning. The patient is planning to be discharged home with home health services

## 2016-06-13 NOTE — Pre-Procedure Instructions (Signed)
Rhonda Gibbs  06/13/2016      [id number] Rhonda Gibbs, [place] - [address] [facility] Rhonda Gibbs [address] [place] [address] Phone: [phone] Fax: [name], [place] [facility] Rhonda Gibbs [address] Phone: [phone] Fax: [phone]    Your procedure is scheduled on July 26.  Report to [facility] Admitting at 845 A.M.  Call this number if you have problems the morning of surgery:  [phone]   Remember:  Do not eat food or drink liquids after midnight.  Take these medicines the morning of surgery with A SIP OF WATER Atenolol (tenormin)  Stop taking aspirin, BC's, Goody's, Herbal medications, Fish Oil, Ibuprofen, Advil, Motrin, Aleve, Vitamins   Do not wear jewelry, make-up or nail polish.  Do not wear lotions, powders, or perfumes.  You may wear deoderant.  Do not shave 48 hours prior to surgery.  Men may shave face and neck.  Do not bring valuables to the hospital.  [facility] is not responsible for any belongings or valuables.  Contacts, dentures or bridgework may not be worn into surgery.  Leave your suitcase in the car.  After surgery it may be brought to your room.  For patients admitted to the hospital, discharge time will be determined by your treatment team.  Patients discharged the day of surgery will not be allowed to drive home.   Special instructions:  [name] - Preparing for Surgery  Before surgery, you can play an important role.  Because skin is not sterile, your skin needs to be as free of germs as possible.  You can reduce the number of germs on you skin by washing with CHG (chlorahexidine gluconate) soap before surgery.  CHG is an antiseptic cleaner which kills germs and bonds with the skin to continue killing germs even after washing.  Please DO NOT use if you have an allergy to CHG or  antibacterial soaps.  If your skin becomes reddened/irritated stop using the CHG and inform your nurse when you arrive at Short Stay.  Do not shave (including legs and underarms) for at least 48 hours prior to the first CHG shower.  You may shave your face.  Please follow these instructions carefully:   1.  Shower with CHG Soap the night before surgery and the                                morning of Surgery.  2.  If you choose to wash your hair, wash your hair first as usual with your       normal shampoo.  3.  After you shampoo, rinse your hair and body thoroughly to remove the                      Shampoo.  4.  Use CHG as you would any other liquid soap.  You can apply chg directly       to the skin and wash gently with scrungie or a clean washcloth.  5.  Apply the CHG Soap to your body ONLY FROM THE NECK DOWN.        Do not use on open wounds or open sores.  Avoid contact with your eyes,       ears, mouth and genitals (  private parts).  Wash genitals (private parts)       with your normal soap.  6.  Wash thoroughly, paying special attention to the area where your surgery        will be performed.  7.  Thoroughly rinse your body with warm water from the neck down.  8.  DO NOT shower/wash with your normal soap after using and rinsing off       the CHG Soap.  9.  Pat yourself dry with a clean towel.            10.  Wear clean pajamas.            11.  Place clean sheets on your bed the night of your first shower and do not        sleep with pets.  Day of Surgery  Do not apply any lotions/deoderants the morning of surgery.  Please wear clean clothes to the hospital/surgery center.     Please read over the following fact sheets that you were given. Pain Booklet, Coughing and Deep Breathing, Blood Transfusion Information, MRSA Information and Surgical Site Infection Prevention

## 2016-06-14 ENCOUNTER — Encounter (HOSPITAL_COMMUNITY): Payer: Self-pay

## 2016-06-14 ENCOUNTER — Encounter (HOSPITAL_COMMUNITY)
Admission: RE | Admit: 2016-06-14 | Discharge: 2016-06-14 | Disposition: A | Discharge status: Home / Self Care | Payer: Commercial Managed Care - HMO | Source: Ambulatory Visit | Attending: Orthopedic Surgery | Admitting: Orthopedic Surgery

## 2016-06-14 DIAGNOSIS — Z01812 Encounter for preprocedural laboratory examination: Secondary | ICD-10-CM | POA: Diagnosis present

## 2016-06-14 HISTORY — DX: Major depressive disorder, single episode, unspecified: F32.9

## 2016-06-14 HISTORY — DX: Depression, unspecified: F32.A

## 2016-06-14 HISTORY — DX: Unspecified osteoarthritis, unspecified site: M19.90

## 2016-06-14 LAB — TYPE AND SCREEN
ABO/RH(D): A POS
Antibody Screen: NEGATIVE

## 2016-06-14 LAB — BASIC METABOLIC PANEL
ANION GAP: 8 (ref 5–15)
BUN: 9 mg/dL (ref 6–20)
CHLORIDE: 101 mmol/L (ref 101–111)
CO2: 26 mmol/L (ref 22–32)
Calcium: 9.1 mg/dL (ref 8.9–10.3)
Creatinine, Ser: 0.79 mg/dL (ref 0.44–1.00)
GFR calc Af Amer: >60 mL/min (ref >60)
GLUCOSE: 87 mg/dL (ref 65–99)
POTASSIUM: 3.4 mmol/L — AB (ref 3.5–5.1)
Sodium: 135 mmol/L (ref 135–145)

## 2016-06-14 LAB — SURGICAL PCR SCREEN
MRSA, PCR: NEGATIVE
STAPHYLOCOCCUS AUREUS: NEGATIVE

## 2016-06-14 LAB — CBC
HEMATOCRIT: 39.6 % (ref 36.0–46.0)
HEMOGLOBIN: 12.8 g/dL (ref 12.0–15.0)
MCH: 31 pg (ref 26.0–34.0)
MCHC: 32.3 g/dL (ref 30.0–36.0)
MCV: 95.9 fL (ref 78.0–100.0)
Platelets: 295 10*3/uL (ref 150–400)
RBC: 4.13 MIL/uL (ref 3.87–5.11)
RDW: 12.5 % (ref 11.5–15.5)
WBC: 6.1 10*3/uL (ref 4.0–10.5)

## 2016-06-14 LAB — ABO/RH: ABO/RH(D): A POS

## 2016-06-14 NOTE — Progress Notes (Signed)
PCP is the [facility] in [place] Denies ever seeing a cardiologist. Denies ever having a stress test, card cath, or echo. Request sent for last EKG done at the [facility].

## 2016-06-25 MED ORDER — SODIUM CHLORIDE 0.9 % IV SOLN
1000.0000 mg | INTRAVENOUS | Status: AC
  Administered 2016-06-26: 1000 mg via INTRAVENOUS
  Filled 2016-06-25: qty 10

## 2016-06-25 MED ORDER — CEFAZOLIN SODIUM-DEXTROSE 2-4 GM/100ML-% IV SOLN
2.0000 g | INTRAVENOUS | Status: AC
  Administered 2016-06-26: 2 g via INTRAVENOUS
  Filled 2016-06-25: qty 100

## 2016-06-25 NOTE — Anesthesia Preprocedure Evaluation (Signed)
Anesthesia Evaluation  Patient identified by MRN, date of birth, ID band Patient awake    Reviewed: Allergy & Precautions, H&P , NPO status , Patient's Chart, lab work & pertinent test results, reviewed documented beta blocker date and time   Airway Mallampati: II  TM Distance: >3 FB Neck ROM: Full    Dental no notable dental hx.    Pulmonary sleep apnea ,    Pulmonary exam normal breath sounds clear to auscultation       Cardiovascular hypertension,  Rhythm:Regular Rate:Normal     Neuro/Psych    GI/Hepatic negative GI ROS, Neg liver ROS,   Endo/Other  negative endocrine ROS  Renal/GU negative Renal ROS     Musculoskeletal   Abdominal   Peds  Hematology   Anesthesia Other Findings   Reproductive/Obstetrics                             Anesthesia Physical  Anesthesia Plan  ASA: III  Anesthesia Plan: General   Post-op Pain Management: GA combined w/ Regional for post-op pain   Induction: Intravenous  Airway Management Planned: LMA  Additional Equipment:   Intra-op Plan:   Post-operative Plan: Extubation in OR  Informed Consent: I have reviewed the patients History and Physical, chart, labs and discussed the procedure including the risks, benefits and alternatives for the proposed anesthesia with the patient or authorized representative who has indicated his/her understanding and acceptance.   Dental advisory given  Plan Discussed with: CRNA and Anesthesiologist  Anesthesia Plan Comments:        Anesthesia Quick Evaluation

## 2016-06-26 ENCOUNTER — Inpatient Hospital Stay (HOSPITAL_COMMUNITY): Payer: No Typology Code available for payment source

## 2016-06-26 ENCOUNTER — Encounter (HOSPITAL_COMMUNITY)
Admission: RE | Disposition: A | Discharge status: Home Health | Payer: Self-pay | Source: Ambulatory Visit | Attending: Orthopedic Surgery

## 2016-06-26 ENCOUNTER — Inpatient Hospital Stay (HOSPITAL_COMMUNITY): Payer: No Typology Code available for payment source | Admitting: Anesthesiology

## 2016-06-26 ENCOUNTER — Inpatient Hospital Stay (HOSPITAL_COMMUNITY)
Admission: RE | Admit: 2016-06-26 | Discharge: 2016-06-27 | DRG: 470 | Disposition: A | Discharge status: Home Health | Payer: No Typology Code available for payment source | Source: Ambulatory Visit | Attending: Orthopedic Surgery | Admitting: Orthopedic Surgery

## 2016-06-26 ENCOUNTER — Encounter (HOSPITAL_COMMUNITY): Payer: Self-pay | Admitting: *Deleted

## 2016-06-26 DIAGNOSIS — Z8042 Family history of malignant neoplasm of prostate: Secondary | ICD-10-CM

## 2016-06-26 DIAGNOSIS — Z800 Family history of malignant neoplasm of digestive organs: Secondary | ICD-10-CM | POA: Diagnosis not present

## 2016-06-26 DIAGNOSIS — I10 Essential (primary) hypertension: Secondary | ICD-10-CM | POA: Diagnosis present

## 2016-06-26 DIAGNOSIS — Z96659 Presence of unspecified artificial knee joint: Secondary | ICD-10-CM

## 2016-06-26 DIAGNOSIS — E7800 Pure hypercholesterolemia, unspecified: Secondary | ICD-10-CM | POA: Diagnosis present

## 2016-06-26 DIAGNOSIS — G4733 Obstructive sleep apnea (adult) (pediatric): Secondary | ICD-10-CM | POA: Diagnosis present

## 2016-06-26 DIAGNOSIS — Z8249 Family history of ischemic heart disease and other diseases of the circulatory system: Secondary | ICD-10-CM

## 2016-06-26 DIAGNOSIS — M1712 Unilateral primary osteoarthritis, left knee: Principal | ICD-10-CM | POA: Diagnosis present

## 2016-06-26 DIAGNOSIS — D62 Acute posthemorrhagic anemia: Secondary | ICD-10-CM | POA: Diagnosis not present

## 2016-06-26 HISTORY — PX: JOINT REPLACEMENT: SHX530

## 2016-06-26 HISTORY — PX: TOTAL KNEE ARTHROPLASTY: SHX125

## 2016-06-26 SURGERY — ARTHROPLASTY, KNEE, TOTAL
Anesthesia: General | Site: Knee | Laterality: Left

## 2016-06-26 MED ORDER — FENTANYL CITRATE (PF) 100 MCG/2ML IJ SOLN
INTRAMUSCULAR | Status: AC
  Filled 2016-06-26: qty 2

## 2016-06-26 MED ORDER — PHENYLEPHRINE 40 MCG/ML (10ML) SYRINGE FOR IV PUSH (FOR BLOOD PRESSURE SUPPORT)
PREFILLED_SYRINGE | INTRAVENOUS | Status: AC
  Filled 2016-06-26: qty 10

## 2016-06-26 MED ORDER — LIDOCAINE 2% (20 MG/ML) 5 ML SYRINGE
INTRAMUSCULAR | Status: AC
  Filled 2016-06-26: qty 5

## 2016-06-26 MED ORDER — CHLORHEXIDINE GLUCONATE 4 % EX LIQD: 60.0000 mL | Freq: Once | CUTANEOUS | Status: DC

## 2016-06-26 MED ORDER — APIXABAN 2.5 MG PO TABS: ORAL_TABLET | ORAL | 0 refills | Status: DC

## 2016-06-26 MED ORDER — SPIRONOLACTONE 25 MG PO TABS
25.0000 mg | ORAL_TABLET | Freq: Every day | ORAL | Status: DC
  Administered 2016-06-26 – 2016-06-27 (×2): 25 mg via ORAL
  Filled 2016-06-26 (×2): qty 1

## 2016-06-26 MED ORDER — PROPOFOL 10 MG/ML IV BOLUS
INTRAVENOUS | Status: DC | PRN
Start: 2016-06-26 — End: 2016-06-26
  Administered 2016-06-26: 200 mg via INTRAVENOUS

## 2016-06-26 MED ORDER — OXYCODONE-ACETAMINOPHEN 5-325 MG PO TABS: 1.0000 | ORAL_TABLET | ORAL | 0 refills | Status: DC | PRN

## 2016-06-26 MED ORDER — PROPOFOL 10 MG/ML IV BOLUS
INTRAVENOUS | Status: AC
  Filled 2016-06-26: qty 20

## 2016-06-26 MED ORDER — METOCLOPRAMIDE HCL 5 MG/ML IJ SOLN: 5.0000 mg | Freq: Three times a day (TID) | INTRAMUSCULAR | Status: DC | PRN

## 2016-06-26 MED ORDER — LIDOCAINE 2% (20 MG/ML) 5 ML SYRINGE
INTRAMUSCULAR | Status: DC | PRN
  Administered 2016-06-26: 40 mg via INTRAVENOUS

## 2016-06-26 MED ORDER — CELECOXIB 200 MG PO CAPS
200.0000 mg | ORAL_CAPSULE | Freq: Two times a day (BID) | ORAL | Status: DC
  Administered 2016-06-26 – 2016-06-27 (×2): 200 mg via ORAL
  Filled 2016-06-26 (×3): qty 1

## 2016-06-26 MED ORDER — CEFAZOLIN SODIUM-DEXTROSE 2-4 GM/100ML-% IV SOLN
2.0000 g | Freq: Four times a day (QID) | INTRAVENOUS | Status: AC
  Administered 2016-06-26 (×2): 2 g via INTRAVENOUS
  Filled 2016-06-26 (×4): qty 100

## 2016-06-26 MED ORDER — EPHEDRINE SULFATE 50 MG/ML IJ SOLN
INTRAMUSCULAR | Status: DC | PRN
  Administered 2016-06-26: 7.5 mg via INTRAVENOUS

## 2016-06-26 MED ORDER — MIDAZOLAM HCL 2 MG/2ML IJ SOLN
2.0000 mg | Freq: Once | INTRAMUSCULAR | Status: AC
  Administered 2016-06-26: 2 mg via INTRAVENOUS

## 2016-06-26 MED ORDER — EPHEDRINE 5 MG/ML INJ
INTRAVENOUS | Status: AC
  Filled 2016-06-26: qty 10

## 2016-06-26 MED ORDER — 0.9 % SODIUM CHLORIDE (POUR BTL) OPTIME
TOPICAL | Status: DC | PRN
  Administered 2016-06-26: 1000 mL

## 2016-06-26 MED ORDER — GLYCOPYRROLATE 0.2 MG/ML IJ SOLN
INTRAMUSCULAR | Status: DC | PRN
  Administered 2016-06-26: 0.4 mg via INTRAVENOUS

## 2016-06-26 MED ORDER — FENTANYL CITRATE (PF) 100 MCG/2ML IJ SOLN
INTRAMUSCULAR | Status: DC | PRN
  Administered 2016-06-26 (×2): 50 ug via INTRAVENOUS

## 2016-06-26 MED ORDER — POTASSIUM CHLORIDE IN NACL 20-0.9 MEQ/L-% IV SOLN
INTRAVENOUS | Status: DC
  Administered 2016-06-26 – 2016-06-27 (×2): via INTRAVENOUS
  Filled 2016-06-26 (×2): qty 1000

## 2016-06-26 MED ORDER — FENTANYL CITRATE (PF) 100 MCG/2ML IJ SOLN
INTRAMUSCULAR | Status: AC
  Administered 2016-06-26: 100 ug via INTRAVENOUS
  Filled 2016-06-26: qty 2

## 2016-06-26 MED ORDER — POLYETHYLENE GLYCOL 3350 17 G PO PACK: 17.0000 g | PACK | Freq: Every day | ORAL | Status: DC | PRN

## 2016-06-26 MED ORDER — ONDANSETRON HCL 4 MG/2ML IJ SOLN: 4.0000 mg | Freq: Four times a day (QID) | INTRAMUSCULAR | Status: DC | PRN

## 2016-06-26 MED ORDER — SPIRONOLACTONE-HCTZ 25-25 MG PO TABS: 1.0000 | ORAL_TABLET | Freq: Every day | ORAL | Status: DC

## 2016-06-26 MED ORDER — SODIUM CHLORIDE 0.9 % IJ SOLN
INTRAMUSCULAR | Status: DC | PRN
  Administered 2016-06-26: 40 mL

## 2016-06-26 MED ORDER — METHOCARBAMOL 500 MG PO TABS: 500.0000 mg | ORAL_TABLET | Freq: Four times a day (QID) | ORAL | 0 refills | Status: DC

## 2016-06-26 MED ORDER — BUPIVACAINE HCL (PF) 0.5 % IJ SOLN
INTRAMUSCULAR | Status: AC
  Filled 2016-06-26: qty 10

## 2016-06-26 MED ORDER — ZOLPIDEM TARTRATE 5 MG PO TABS: 5.0000 mg | ORAL_TABLET | Freq: Every evening | ORAL | Status: DC | PRN

## 2016-06-26 MED ORDER — MENTHOL 3 MG MT LOZG: 1.0000 | LOZENGE | OROMUCOSAL | Status: DC | PRN

## 2016-06-26 MED ORDER — ACETAMINOPHEN 650 MG RE SUPP: 650.0000 mg | Freq: Four times a day (QID) | RECTAL | Status: DC | PRN

## 2016-06-26 MED ORDER — SODIUM CHLORIDE 0.9 % IR SOLN
Status: DC | PRN
  Administered 2016-06-26: 3000 mL

## 2016-06-26 MED ORDER — APIXABAN 2.5 MG PO TABS
2.5000 mg | ORAL_TABLET | Freq: Two times a day (BID) | ORAL | Status: DC
  Administered 2016-06-27: 2.5 mg via ORAL
  Filled 2016-06-26: qty 1

## 2016-06-26 MED ORDER — MAGNESIUM CITRATE PO SOLN: 1.0000 | Freq: Once | ORAL | Status: DC | PRN

## 2016-06-26 MED ORDER — ONDANSETRON HCL 4 MG/2ML IJ SOLN
INTRAMUSCULAR | Status: AC
  Filled 2016-06-26: qty 2

## 2016-06-26 MED ORDER — ACETAMINOPHEN 10 MG/ML IV SOLN
1000.0000 mg | Freq: Once | INTRAVENOUS | Status: AC
  Administered 2016-06-26: 1000 mg via INTRAVENOUS

## 2016-06-26 MED ORDER — FENTANYL CITRATE (PF) 100 MCG/2ML IJ SOLN
25.0000 ug | INTRAMUSCULAR | Status: DC | PRN
  Administered 2016-06-26 (×4): 50 ug via INTRAVENOUS

## 2016-06-26 MED ORDER — TOPIRAMATE 25 MG PO TABS: 25.0000 mg | ORAL_TABLET | Freq: Two times a day (BID) | ORAL | Status: DC | PRN

## 2016-06-26 MED ORDER — BUPIVACAINE HCL 0.5 % IJ SOLN
INTRAMUSCULAR | Status: DC | PRN
  Administered 2016-06-26: 10 mL

## 2016-06-26 MED ORDER — OXYCODONE HCL 5 MG PO TABS
5.0000 mg | ORAL_TABLET | ORAL | Status: DC | PRN
  Administered 2016-06-26 – 2016-06-27 (×2): 10 mg via ORAL
  Filled 2016-06-26: qty 2

## 2016-06-26 MED ORDER — HYDROCHLOROTHIAZIDE 25 MG PO TABS
25.0000 mg | ORAL_TABLET | Freq: Every day | ORAL | Status: DC
  Administered 2016-06-26 – 2016-06-27 (×2): 25 mg via ORAL
  Filled 2016-06-26 (×2): qty 1

## 2016-06-26 MED ORDER — FENTANYL CITRATE (PF) 100 MCG/2ML IJ SOLN: 100.0000 ug | Freq: Once | INTRAMUSCULAR | Status: DC

## 2016-06-26 MED ORDER — FENTANYL CITRATE (PF) 250 MCG/5ML IJ SOLN
INTRAMUSCULAR | Status: AC
  Filled 2016-06-26: qty 5

## 2016-06-26 MED ORDER — METHOCARBAMOL 500 MG PO TABS
ORAL_TABLET | ORAL | Status: AC
  Filled 2016-06-26: qty 1

## 2016-06-26 MED ORDER — DIPHENHYDRAMINE HCL 12.5 MG/5ML PO ELIX
12.5000 mg | ORAL_SOLUTION | ORAL | Status: DC | PRN
  Administered 2016-06-26: 12.5 mg via ORAL
  Filled 2016-06-26: qty 10

## 2016-06-26 MED ORDER — METHOCARBAMOL 500 MG PO TABS
500.0000 mg | ORAL_TABLET | Freq: Four times a day (QID) | ORAL | Status: DC | PRN
  Administered 2016-06-26 (×2): 500 mg via ORAL
  Filled 2016-06-26: qty 1

## 2016-06-26 MED ORDER — GLYCOPYRROLATE 0.2 MG/ML IV SOSY
PREFILLED_SYRINGE | INTRAVENOUS | Status: AC
  Filled 2016-06-26: qty 3

## 2016-06-26 MED ORDER — FENTANYL CITRATE (PF) 100 MCG/2ML IJ SOLN
100.0000 ug | Freq: Once | INTRAMUSCULAR | Status: AC
  Administered 2016-06-26: 100 ug via INTRAVENOUS

## 2016-06-26 MED ORDER — ACETAMINOPHEN 325 MG PO TABS: 650.0000 mg | ORAL_TABLET | Freq: Four times a day (QID) | ORAL | Status: DC | PRN

## 2016-06-26 MED ORDER — ATENOLOL 50 MG PO TABS
50.0000 mg | ORAL_TABLET | Freq: Every day | ORAL | Status: DC
  Administered 2016-06-27: 50 mg via ORAL
  Filled 2016-06-26: qty 1

## 2016-06-26 MED ORDER — PHENYLEPHRINE HCL 10 MG/ML IJ SOLN
INTRAMUSCULAR | Status: DC | PRN
  Administered 2016-06-26: 80 ug via INTRAVENOUS

## 2016-06-26 MED ORDER — BUPIVACAINE LIPOSOME 1.3 % IJ SUSP
20.0000 mL | INTRAMUSCULAR | Status: AC
  Administered 2016-06-26: 20 mL
  Filled 2016-06-26: qty 20

## 2016-06-26 MED ORDER — PHENOL 1.4 % MT LIQD: 1.0000 | OROMUCOSAL | Status: DC | PRN

## 2016-06-26 MED ORDER — LACTATED RINGERS IV SOLN
INTRAVENOUS | Status: DC
  Administered 2016-06-26: 09:00:00 via INTRAVENOUS

## 2016-06-26 MED ORDER — METOCLOPRAMIDE HCL 5 MG PO TABS: 5.0000 mg | ORAL_TABLET | Freq: Three times a day (TID) | ORAL | Status: DC | PRN

## 2016-06-26 MED ORDER — BISACODYL 10 MG RE SUPP: 10.0000 mg | Freq: Every day | RECTAL | Status: DC | PRN

## 2016-06-26 MED ORDER — MIDAZOLAM HCL 2 MG/2ML IJ SOLN
INTRAMUSCULAR | Status: AC
  Filled 2016-06-26: qty 2

## 2016-06-26 MED ORDER — LACTATED RINGERS IV SOLN: INTRAVENOUS | Status: DC

## 2016-06-26 MED ORDER — DEXAMETHASONE SODIUM PHOSPHATE 10 MG/ML IJ SOLN
10.0000 mg | Freq: Once | INTRAMUSCULAR | Status: AC
  Administered 2016-06-27: 10 mg via INTRAVENOUS
  Filled 2016-06-26: qty 1

## 2016-06-26 MED ORDER — ONDANSETRON HCL 4 MG PO TABS: 4.0000 mg | ORAL_TABLET | Freq: Three times a day (TID) | ORAL | 0 refills | Status: DC | PRN

## 2016-06-26 MED ORDER — HYDROMORPHONE HCL 1 MG/ML IJ SOLN
INTRAMUSCULAR | Status: AC
  Filled 2016-06-26: qty 1

## 2016-06-26 MED ORDER — HYDROMORPHONE HCL 1 MG/ML IJ SOLN
0.5000 mg | INTRAMUSCULAR | Status: DC | PRN
  Administered 2016-06-26 – 2016-06-27 (×9): 1 mg via INTRAVENOUS
  Filled 2016-06-26 (×9): qty 1

## 2016-06-26 MED ORDER — ONDANSETRON HCL 4 MG/2ML IJ SOLN
INTRAMUSCULAR | Status: DC | PRN
  Administered 2016-06-26: 4 mg via INTRAVENOUS

## 2016-06-26 MED ORDER — MIDAZOLAM HCL 2 MG/2ML IJ SOLN
INTRAMUSCULAR | Status: AC
  Administered 2016-06-26: 2 mg via INTRAVENOUS
  Filled 2016-06-26: qty 2

## 2016-06-26 MED ORDER — OXYCODONE HCL 5 MG PO TABS
ORAL_TABLET | ORAL | Status: AC
  Filled 2016-06-26: qty 2

## 2016-06-26 MED ORDER — ACETAMINOPHEN 10 MG/ML IV SOLN
INTRAVENOUS | Status: AC
  Filled 2016-06-26: qty 100

## 2016-06-26 MED ORDER — ALUM & MAG HYDROXIDE-SIMETH 200-200-20 MG/5ML PO SUSP: 30.0000 mL | ORAL | Status: DC | PRN

## 2016-06-26 MED ORDER — ONDANSETRON HCL 4 MG PO TABS: 4.0000 mg | ORAL_TABLET | Freq: Four times a day (QID) | ORAL | Status: DC | PRN

## 2016-06-26 MED ORDER — METHOCARBAMOL 1000 MG/10ML IJ SOLN
500.0000 mg | Freq: Four times a day (QID) | INTRAVENOUS | Status: DC | PRN
  Filled 2016-06-26: qty 5

## 2016-06-26 MED ORDER — DOCUSATE SODIUM 100 MG PO CAPS
100.0000 mg | ORAL_CAPSULE | Freq: Two times a day (BID) | ORAL | Status: DC
  Administered 2016-06-26 – 2016-06-27 (×2): 100 mg via ORAL
  Filled 2016-06-26 (×3): qty 1

## 2016-06-26 SURGICAL SUPPLY — 65 items
APL SKNCLS STERI-STRIP NONHPOA (GAUZE/BANDAGES/DRESSINGS) ×1
BANDAGE ELASTIC 4 VELCRO ST LF (GAUZE/BANDAGES/DRESSINGS) ×2 IMPLANT
BANDAGE ELASTIC 6 VELCRO ST LF (GAUZE/BANDAGES/DRESSINGS) ×2 IMPLANT
BANDAGE ESMARK 6X9 LF (GAUZE/BANDAGES/DRESSINGS) ×1 IMPLANT
BENZOIN TINCTURE PRP APPL 2/3 (GAUZE/BANDAGES/DRESSINGS) ×2 IMPLANT
BLADE SAG 18X100X1.27 (BLADE) ×4 IMPLANT
BNDG CMPR 9X6 STRL LF SNTH (GAUZE/BANDAGES/DRESSINGS) ×1
BNDG ESMARK 6X9 LF (GAUZE/BANDAGES/DRESSINGS) ×2
BOWL SMART MIX CTS (DISPOSABLE) ×2 IMPLANT
CAPT KNEE TOTAL 3 ×2 IMPLANT
CEMENT BONE SIMPLEX SPEEDSET (Cement) ×4 IMPLANT
COVER SURGICAL LIGHT HANDLE (MISCELLANEOUS) ×2 IMPLANT
CUFF TOURNIQUET SINGLE 34IN LL (TOURNIQUET CUFF) ×2 IMPLANT
DRAPE EXTREMITY T 121X128X90 (DRAPE) ×2 IMPLANT
DRAPE IMP U-DRAPE 54X76 (DRAPES) ×2 IMPLANT
DRAPE PROXIMA HALF (DRAPES) ×2 IMPLANT
DRAPE U-SHAPE 47X51 STRL (DRAPES) ×2 IMPLANT
DRSG PAD ABDOMINAL 8X10 ST (GAUZE/BANDAGES/DRESSINGS) ×4 IMPLANT
DURAPREP 26ML APPLICATOR (WOUND CARE) ×2 IMPLANT
ELECT CAUTERY BLADE 6.4 (BLADE) ×2 IMPLANT
ELECT REM PT RETURN 9FT ADLT (ELECTROSURGICAL) ×2
ELECTRODE REM PT RTRN 9FT ADLT (ELECTROSURGICAL) ×1 IMPLANT
EVACUATOR 1/8 PVC DRAIN (DRAIN) IMPLANT
FACESHIELD WRAPAROUND (MASK) ×4 IMPLANT
GAUZE SPONGE 4X4 12PLY STRL (GAUZE/BANDAGES/DRESSINGS) ×2 IMPLANT
GLOVE BIOGEL PI IND STRL 7.0 (GLOVE) ×1 IMPLANT
GLOVE BIOGEL PI INDICATOR 7.0 (GLOVE) ×1
GLOVE ORTHO TXT STRL SZ7.5 (GLOVE) ×2 IMPLANT
GLOVE SURG ORTHO 7.0 STRL STRW (GLOVE) ×2 IMPLANT
GOWN STRL REUS W/ TWL LRG LVL3 (GOWN DISPOSABLE) ×2 IMPLANT
GOWN STRL REUS W/ TWL XL LVL3 (GOWN DISPOSABLE) ×1 IMPLANT
GOWN STRL REUS W/TWL LRG LVL3 (GOWN DISPOSABLE) ×4
GOWN STRL REUS W/TWL XL LVL3 (GOWN DISPOSABLE) ×2
HANDPIECE INTERPULSE COAX TIP (DISPOSABLE) ×1
IMMOBILIZER KNEE 22 UNIV (SOFTGOODS) ×2 IMPLANT
IMMOBILIZER KNEE 24 THIGH 36 (MISCELLANEOUS) IMPLANT
IMMOBILIZER KNEE 24 UNIV (MISCELLANEOUS)
KIT BASIN OR (CUSTOM PROCEDURE TRAY) ×2 IMPLANT
KIT ROOM TURNOVER OR (KITS) ×2 IMPLANT
MANIFOLD NEPTUNE II (INSTRUMENTS) ×2 IMPLANT
NEEDLE 18GX1X1/2 (RX/OR ONLY) (NEEDLE) ×2 IMPLANT
NEEDLE HYPO 25GX1X1/2 BEV (NEEDLE) ×2 IMPLANT
NS IRRIG 1000ML POUR BTL (IV SOLUTION) ×2 IMPLANT
PACK TOTAL JOINT (CUSTOM PROCEDURE TRAY) ×2 IMPLANT
PACK UNIVERSAL I (CUSTOM PROCEDURE TRAY) IMPLANT
PAD ARMBOARD 7.5X6 YLW CONV (MISCELLANEOUS) ×4 IMPLANT
PAD CAST 4YDX4 CTTN HI CHSV (CAST SUPPLIES) ×1 IMPLANT
PADDING CAST COTTON 4X4 STRL (CAST SUPPLIES) ×1
PADDING CAST COTTON 6X4 STRL (CAST SUPPLIES) ×2 IMPLANT
SET HNDPC FAN SPRY TIP SCT (DISPOSABLE) ×1 IMPLANT
STRIP CLOSURE SKIN 1/2X4 (GAUZE/BANDAGES/DRESSINGS) ×2 IMPLANT
SUCTION FRAZIER HANDLE 10FR (MISCELLANEOUS) ×1
SUCTION TUBE FRAZIER 10FR DISP (MISCELLANEOUS) ×1 IMPLANT
SUT MNCRL AB 4-0 PS2 18 (SUTURE) ×2 IMPLANT
SUT VIC AB 0 CT1 27 (SUTURE) ×4
SUT VIC AB 0 CT1 27XBRD ANBCTR (SUTURE) ×2 IMPLANT
SUT VIC AB 1 CTX 36 (SUTURE) ×4
SUT VIC AB 1 CTX36XBRD ANBCTR (SUTURE) ×2 IMPLANT
SUT VIC AB 2-0 CT1 27 (SUTURE) ×4
SUT VIC AB 2-0 CT1 TAPERPNT 27 (SUTURE) ×2 IMPLANT
SYR 50ML LL SCALE MARK (SYRINGE) ×2 IMPLANT
SYR CONTROL 10ML LL (SYRINGE) ×2 IMPLANT
TOWEL OR 17X24 6PK STRL BLUE (TOWEL DISPOSABLE) ×2 IMPLANT
TOWEL OR 17X26 10 PK STRL BLUE (TOWEL DISPOSABLE) ×2 IMPLANT
WATER STERILE IRR 1000ML POUR (IV SOLUTION) IMPLANT

## 2016-06-26 NOTE — Anesthesia Postprocedure Evaluation (Signed)
Anesthesia Post Note  Patient: Rhonda Gibbs  Procedure(s) Performed: Procedure(s) (LRB): TOTAL KNEE ARTHROPLASTY (Left)  Patient location during evaluation: PACU Anesthesia Type: General Level of consciousness: sedated Pain management: satisfactory to patient Vital Signs Assessment: post-procedure vital signs reviewed and stable Respiratory status: spontaneous breathing Cardiovascular status: stable Anesthetic complications: no    Last Vitals:  Vitals:   06/26/16 0915 06/26/16 1140  BP: 112/75 (!) 130/94  Pulse: (!) 56   Resp: 13   Temp:  37.1 C    Last Pain:  Vitals:   06/26/16 1211  TempSrc:   PainSc: 10-Worst pain ever                 Riccardo Dubin

## 2016-06-26 NOTE — Discharge Summary (Addendum)
Patient ID: Rhonda Gibbs MRN: [id number] DOB/AGE: December 19, 1970 45 y.o.  Admit date: 06/26/2016 Discharge date: 06/26/2016  Admission Diagnoses:  Active Problems:   Primary localized osteoarthritis of left knee   Discharge Diagnoses:  Same  Past Medical History:  Diagnosis Date  . Arthritis   . Depression   . High cholesterol   . Hypertension   . Migraine   . OSA on CPAP     Surgeries: Procedure(s): TOTAL KNEE ARTHROPLASTY on 06/26/2016   Consultants:   Discharged Condition: Improved  Hospital Course: Rhonda Gibbs is an 45 y.o. female who was admitted 06/26/2016 for operative treatment of primary localized osteoarthritis left knee. Patient has severe unremitting pain that affects sleep, daily activities, and work/hobbies. After pre-op clearance the patient was taken to the operating room on 06/26/2016 and underwent  Procedure(s): TOTAL KNEE ARTHROPLASTY.    Patient was given perioperative antibiotics: Anti-infectives    Start     Dose/Rate Route Frequency Ordered Stop   06/26/16 0930  ceFAZolin (ANCEF) IVPB 2g/100 mL premix     2 g 200 mL/hr over 30 Minutes Intravenous To ShortStay Surgical 06/25/16 1034 06/26/16 1004       Patient was given sequential compression devices, early ambulation, and chemoprophylaxis to prevent DVT.  Patient benefited maximally from hospital stay and there were no complications.    Recent vital signs: Patient Vitals for the past 24 hrs:  BP Temp Temp src Pulse Resp SpO2 Height Weight  06/26/16 1140 (!) 130/94 98.7 F (37.1 C) - - - - - -  06/26/16 0915 112/75 - - (!) 56 13 97 % - -  06/26/16 0910 114/79 - - (!) 59 10 98 % - -  06/26/16 0905 (!) 128/91 - - (!) 55 12 97 % - -  06/26/16 0900 114/63 - - 62 13 94 % - -  06/26/16 0855 (!) 132/92 - - 76 12 100 % - -  06/26/16 0850 (!) 141/98 - - 71 15 99 % - -  06/26/16 0813 138/82 - Oral 67 18 100 % 5\' 5"  (1.651 m) 85.7 kg (189 lb)     Recent laboratory studies: No results for  input(s): WBC, HGB, HCT, PLT, NA, K, CL, CO2, BUN, CREATININE, GLUCOSE, INR, CALCIUM in the last 72 hours.  Invalid input(s): PT, 2   Discharge Medications:     Medication List    TAKE these medications   apixaban 2.5 MG Tabs tablet Commonly known as:  ELIQUIS Take 1 tab po q 12 hours x 14 days following surgery to prevent blood clots   atenolol 50 MG tablet Commonly known as:  TENORMIN Take 50 mg by mouth daily.   methocarbamol 500 MG tablet Commonly known as:  ROBAXIN Take 1 tablet (500 mg total) by mouth 4 (four) times daily.   ondansetron 4 MG tablet Commonly known as:  ZOFRAN Take 1 tablet (4 mg total) by mouth every 8 (eight) hours as needed for nausea or vomiting.   oxyCODONE-acetaminophen 5-325 MG tablet Commonly known as:  ROXICET Take 1-2 tablets by mouth every 4 (four) hours as needed.   spironolactone-hydrochlorothiazide 25-25 MG tablet Commonly known as:  ALDACTAZIDE Take 1 tablet by mouth daily.   topiramate 25 MG tablet Commonly known as:  TOPAMAX Take 25 mg by mouth 2 (two) times daily as needed. Reported on 06/05/2016       Diagnostic Studies: No results found.  Disposition: 01-Home or Self Care       Signed: M  Tawanna Cooler 06/26/2016, 12:47 PM

## 2016-06-26 NOTE — Progress Notes (Signed)
Orthopedic Tech Progress Note Patient Details:  Rhonda Gibbs October 27, 1971 [id number]  CPM Left Knee CPM Left Knee: On Left Knee Flexion (Degrees): 90 Left Knee Extension (Degrees): 0 Additional Comments: trapeze bar patient helper   Rhonda Gibbs 06/26/2016, 6:55 PM

## 2016-06-26 NOTE — H&P (View-Only) (Signed)
TOTAL KNEE ADMISSION H&P  Patient is being admitted for left total knee arthroplasty.  Subjective:  Chief Complaint:left knee pain.  HPI: Rhonda Gibbs, 45 y.o. female, has a history of pain and functional disability in the left knee due to arthritis and has failed non-surgical conservative treatments for greater than 12 weeks to includecorticosteriod injections, viscosupplementation injections and use of assistive devices.  Onset of symptoms was gradual, starting >10 years ago with rapidlly worsening course since that time. The patient noted prior procedures on the knee to include  arthroscopy and menisectomy on the left knee(s).  Patient currently rates pain in the left knee(s) at 10 out of 10 with activity. Patient has night pain, worsening of pain with activity and weight bearing, pain that interferes with activities of daily living and crepitus.  Patient has evidence of subchondral sclerosis and joint space narrowing by imaging studies. There is no active infection.  Patient Active Problem List   Diagnosis Date Noted  . HTN (hypertension) 01/31/2014   Past Medical History  Diagnosis Date  . Hypertension   . High cholesterol   . Migraine   . OSA on CPAP     Past Surgical History  Procedure Laterality Date  . Abdominal hysterectomy    . Knee arthroscopy with lateral release Left 08/03/2015    Procedure: LEFT KNEE ARTHROSCOPY  CHONDROPLASTY WITH LATERAL RELEASE;  Surgeon: [name], MD;  Location: [place];  Service: Orthopedics;  Laterality: Left;  . Chondroplasty Left 08/03/2015    Procedure: CHONDROPLASTY;  Surgeon: [name], MD;  Location: [facility];  Service: Orthopedics;  Laterality: Left;     (Not in a hospital admission) No Known Allergies  Social History  Substance Use Topics  . Smoking status: Never Smoker   . Smokeless tobacco: Not on file  . Alcohol Use: Yes     Comment: wine    Family History  Problem Relation Age of  Onset  . Hypertension Mother   . Diabetes Father   . Hypertension Father   . Heart disease Father     chf  . Hypertension Brother   . Cancer Maternal Grandfather     colon  . Cancer Paternal Grandmother     cervical or colon????  . Cancer Cousin     prostate     Review of Systems  Constitutional: Negative.   HENT: Negative.   Eyes: Negative.   Respiratory: Negative.   Cardiovascular: Negative.   Gastrointestinal: Negative.   Genitourinary: Negative.   Musculoskeletal: Positive for joint pain.  Skin: Negative.   Neurological: Negative.   Endo/Heme/Allergies: Negative.   Psychiatric/Behavioral: Negative.     Objective:  Physical Exam  Constitutional: She is oriented to person, place, and time. She appears well-developed and well-nourished.  Eyes: EOM are normal. Pupils are equal, round, and reactive to light.  Neck: Normal range of motion. Neck supple.  Respiratory: Effort normal and breath sounds normal.  GI: Soft. Bowel sounds are normal.  Musculoskeletal:  Specifically, she is wearing a brace all the time on her left knee.  She is getting weaker and stiffer.  I still can get full extension and 90 degrees of flexion, but she is getting more and more quad atrophy.    Neurological: She is alert and oriented to person, place, and time.  Skin: Skin is warm and dry.  Psychiatric: She has a normal mood and affect. Her behavior is normal. Judgment and thought content normal.  Vital signs in last 24 hours: @VSRANGES @  Labs:   Estimated body mass index is 29.12 kg/(m^2) as calculated from the following:   Height as of 08/03/15: 5\' 5"  (1.651 m).   Weight as of 08/03/15: 79.379 kg (175 lb).   Imaging Review Plain radiographs demonstrate severe degenerative joint disease of the left knee(s). The overall alignment isneutral. The bone quality appears to be fair for age and reported activity level.  Assessment/Plan:  End stage arthritis, left knee   The patient history,  physical examination, clinical judgment of the provider and imaging studies are consistent with end stage degenerative joint disease of the left knee(s) and total knee arthroplasty is deemed medically necessary. The treatment options including medical management, injection therapy arthroscopy and arthroplasty were discussed at length. The risks and benefits of total knee arthroplasty were presented and reviewed. The risks due to aseptic loosening, infection, stiffness, patella tracking problems, thromboembolic complications and other imponderables were discussed. The patient acknowledged the explanation, agreed to proceed with the plan and consent was signed. Patient is being admitted for inpatient treatment for surgery, pain control, PT, OT, prophylactic antibiotics, VTE prophylaxis, progressive ambulation and ADL's and discharge planning. The patient is planning to be discharged home with home health services

## 2016-06-26 NOTE — Transfer of Care (Signed)
Immediate Anesthesia Transfer of Care Note  Patient: Rhonda Gibbs  Procedure(s) Performed: Procedure(s): TOTAL KNEE ARTHROPLASTY (Left)  Patient Location: PACU  Anesthesia Type:GA combined with regional for post-op pain  Level of Consciousness: awake, alert  and oriented  Airway & Oxygen Therapy: Patient Spontanous Breathing and Patient connected to nasal cannula oxygen  Post-op Assessment: Report given to RN, Post -op Vital signs reviewed and stable and Patient moving all extremities X 4  Post vital signs: Reviewed and stable  Last Vitals:  Vitals:   06/26/16 0915 06/26/16 1140  BP: 112/75   Pulse: (!) 56   Resp: 13   Temp:  (P) 37.1 C    Last Pain:  Vitals:   06/26/16 1140  TempSrc:   PainSc: (P) 10-Worst pain ever         Complications: No apparent anesthesia complications

## 2016-06-26 NOTE — Progress Notes (Signed)
Orthopedic Tech Progress Note Patient Details:  Rhonda Gibbs Nov 27, 1971 [id number]  CPM Left Knee CPM Left Knee: On Left Knee Flexion (Degrees): 90 Left Knee Extension (Degrees): 0 Additional Comments: trapeze bar patient helper Viewed order from doctor's order list  Hildred Priest 06/26/2016, 1:37 PM Viewed order from doctor's order list

## 2016-06-26 NOTE — Anesthesia Procedure Notes (Signed)
Procedure Name: LMA Insertion Date/Time: 06/26/2016 10:02 AM Performed by: Rush Farmer E Pre-anesthesia Checklist: Patient identified, Emergency Drugs available, Suction available, Patient being monitored and Timeout performed Patient Re-evaluated:Patient Re-evaluated prior to inductionOxygen Delivery Method: Circle system utilized Preoxygenation: Pre-oxygenation with 100% oxygen Intubation Type: IV induction Ventilation: Mask ventilation without difficulty LMA: LMA inserted LMA Size: 4.0 Number of attempts: 1 Placement Confirmation: positive ETCO2 and breath sounds checked- equal and bilateral Tube secured with: Tape Dental Injury: Teeth and Oropharynx as per pre-operative assessment

## 2016-06-26 NOTE — Discharge Instructions (Signed)

## 2016-06-26 NOTE — Interval H&P Note (Signed)
History and Physical Interval Note:  06/26/2016 8:31 AM  Rhonda Gibbs  has presented today for surgery, with the diagnosis of DJD LEFT KNEE  The various methods of treatment have been discussed with the patient and family. After consideration of risks, benefits and other options for treatment, the patient has consented to  Procedure(s): TOTAL KNEE ARTHROPLASTY (Left) as a surgical intervention .  The patient's history has been reviewed, patient examined, no change in status, stable for surgery.  I have reviewed the patient's chart and labs.  Questions were answered to the patient's satisfaction.     [name]

## 2016-06-26 NOTE — Op Note (Signed)
NAMERAYHANA, MARDEN             ACCOUNT NO.:  [id number]  MEDICAL RECORD NO.:  [id number]  LOCATION:  5N31C                        FACILITY:  [facility]  PHYSICIAN:  [name], M.D. DATE OF BIRTH:  August 23, 1971  DATE OF PROCEDURE:  06/26/2016 DATE OF DISCHARGE:                              OPERATIVE REPORT   PREOPERATIVE DIAGNOSIS:  Left knee end-stage arthritis, primary localized.  POSTOPERATIVE DIAGNOSIS:  Left knee end-stage arthritis, primary localized.  PROCEDURE:  Modified minimally invasive left total knee replacement with Stryker triathlon prosthesis.  A cemented pegged cruciate retaining #3 femoral component.  Cemented #3 tibial component, 9 mm CS insert. Cemented resurfacing 29 mm patellar component.  SURGEON:  [name], M.D.  ASSISTANT:  Elmyra Ricks, P.A., who present throughout the entire case and necessary for timely completion of procedure.  ANESTHESIA:  General.  BLOOD LOSS:  Minimal.  SPECIMENS:  None.  CULTURES:  None.  COMPLICATIONS:  None.  DRESSINGS:  Soft compressive knee immobilizer.  TOURNIQUET TIME:  50 minutes.  DESCRIPTION OF PROCEDURE:  The patient was brought to operating room and after adequate general anesthesia had been obtained, tourniquet applied. Prepped and draped in usual sterile fashion.  Exsanguinated with elevation of Esmarch, tourniquet inflated to 350 mmHg.  Straight incision above the patella down to tibial tubercle.  Medial arthrotomy, vastus splitting.  Intramedullary guide distal femur.  8 mm resection, 5 degrees of valgus.  Using epicondylar axis, the femur was sized, cut, and fitted for pegged cruciate retaining #3 component.  Proximal tibial resection with extramedullary guide.  A 3-degree posterior slope cut. Size #3 component.  Patella exposed, posterior 9 mm removed.  Drilled, sized, and fitted for 29 mm component.  With a 9 mm CS insert, I was very pleased with balancing, motion, stability, and  tracking.  Tibia was marked for rotation and hand reamed.  All trials removed.  Copious irrigation with a pulse irrigating device.  Cement prepared, placed on all components, firmly seated.  Polyethylene attached to tibia, knee reduced.  Once cement hardened, the knee was irrigated again.  Soft tissues injected with Exparel.  Arthrotomy closed with #1 Vicryl with a subcutaneous subcuticular closure.  Margins were injected with Marcaine. Sterile compressive dressing applied.  Tourniquet deflated and removed. Knee immobilizer applied.  Anesthesia reversed.  Brought to the recovery room.  Tolerated the surgery well.  No complications.     [name], M.D.     DFM/MEDQ  D:  06/26/2016  T:  06/26/2016  Job:  [id number]

## 2016-06-27 ENCOUNTER — Encounter (HOSPITAL_COMMUNITY): Payer: Self-pay | Admitting: Orthopedic Surgery

## 2016-06-27 ENCOUNTER — Encounter (HOSPITAL_COMMUNITY): Payer: Self-pay | Admitting: Pharmacist

## 2016-06-27 LAB — CBC
HCT: 34.3 % — ABNORMAL LOW (ref 36.0–46.0)
Hemoglobin: 11.5 g/dL — ABNORMAL LOW (ref 12.0–15.0)
MCH: 31.7 pg (ref 26.0–34.0)
MCHC: 33.5 g/dL (ref 30.0–36.0)
MCV: 94.5 fL (ref 78.0–100.0)
PLATELETS: 232 10*3/uL (ref 150–400)
RBC: 3.63 MIL/uL — AB (ref 3.87–5.11)
RDW: 12.8 % (ref 11.5–15.5)
WBC: 10 10*3/uL (ref 4.0–10.5)

## 2016-06-27 LAB — BASIC METABOLIC PANEL
Anion gap: 5 (ref 5–15)
BUN: 7 mg/dL (ref 6–20)
CHLORIDE: 102 mmol/L (ref 101–111)
CO2: 28 mmol/L (ref 22–32)
Calcium: 8.5 mg/dL — ABNORMAL LOW (ref 8.9–10.3)
Creatinine, Ser: 0.88 mg/dL (ref 0.44–1.00)
GFR calc Af Amer: >60 mL/min (ref >60)
GFR calc non Af Amer: >60 mL/min (ref >60)
GLUCOSE: 121 mg/dL — AB (ref 65–99)
POTASSIUM: 3.6 mmol/L (ref 3.5–5.1)
SODIUM: 135 mmol/L (ref 135–145)

## 2016-06-27 NOTE — Evaluation (Signed)
Physical Therapy Evaluation Patient Details Name: Rhonda Gibbs MRN: [id number] DOB: December 25, 1970 Today's Date: 06/27/2016   History of Present Illness  Pt is a 45 y/o female s/p L TKR. PMH including but not limited to HTN, L knee arthroscopy with lateral release (2016).  Clinical Impression  Pt presented supine in bed, initially asleep; however, easily aroused and willing to participate in therapy session. Pt reported an 8/10 pain in her L knee at beginning of session and stated increased pain with movement. Pt requested no physical assistance from therapist for bed mobility or transfers. Additionally, she refused assistance from RW during transfers. Secondary to pain, pt refused to participate in any additional therapeutic exercises at this time. PT encouraged pt to remain sitting upright in recliner if tolerable as it is beneficial for all body systems following surgery. Pt would continue to benefit from skilled physical therapy services at this time while admitted and after d/c to address her below listed limitations in order to improve her overall safety and independence with functional mobility.     Follow Up Recommendations Home health PT;Supervision for mobility/OOB    Equipment Recommendations  Other (comment) (pt stated that she has all necessary equipment at home)    Recommendations for Other Services       Precautions / Restrictions Precautions Precautions: Knee;Fall Precaution Comments: PT discussed precautions and positioning following TKR surgery with pt. Restrictions Weight Bearing Restrictions: Yes LLE Weight Bearing: Weight bearing as tolerated      Mobility  Bed Mobility Overal bed mobility: Needs Assistance Bed Mobility: Supine to Sit     Supine to sit: Supervision;HOB elevated     General bed mobility comments: Pt required increased time to complete task.  Transfers Overall transfer level: Needs assistance Equipment used: None (pt refused to utilize  RW during transfer) Transfers: Sit to/from [employer] to Stand: Min guard Stand pivot transfers: Min guard       General transfer comment: Pt refused to use the RW when presented to assist with transfers. Pt also refused any physical assistance from PT. Pt required increased time to complete task and utilized UEs on arm rests of chair to perform transfers.  Ambulation/Gait             General Gait Details: Unable to assess as pt refused ambulation at this time secondary to pain.  Stairs            Wheelchair Mobility    Modified Rankin (Stroke Patients Only)       Balance Overall balance assessment: Needs assistance Sitting-balance support: Feet supported;No upper extremity supported Sitting balance-Leahy Scale: Fair       Standing balance-Leahy Scale: Poor                               Pertinent Vitals/Pain Pain Assessment: 0-10 Pain Score: 8  Pain Location: L knee Pain Descriptors / Indicators: Discomfort;Operative site guarding Pain Intervention(s): Limited activity within patient's tolerance;Monitored during session;Repositioned;Ice applied    Home Living Family/patient expects to be discharged to:: Private residence Living Arrangements: Spouse/significant other Available Help at Discharge: Family Type of Home: House Home Access: Level entry     Home Layout: Multi-level;Able to live on main level with bedroom/bathroom Home Equipment: Gilford Rile - 2 wheels;Cane - single point      Prior Function Level of Independence: Independent               Hand  Dominance        Extremity/Trunk Assessment   Upper Extremity Assessment: Defer to OT evaluation           Lower Extremity Assessment: LLE deficits/detail   LLE Deficits / Details: Pt with decreased strength and ROM limitations secondary to post-op. Sensation grossly intact.     Communication   Communication: No difficulties  Cognition  Arousal/Alertness: Awake/alert Behavior During Therapy: WFL for tasks assessed/performed;Agitated Overall Cognitive Status: Within Functional Limits for tasks assessed                      General Comments      Exercises Total Joint Exercises Ankle Circles/Pumps: AROM;Strengthening;Left;10 reps;Seated Goniometric ROM: Flexion: 64 degrees, Extension: lacking 20 degrees to neutral  (seated)      Assessment/Plan    PT Assessment Patient needs continued PT services  PT Diagnosis Difficulty walking   PT Problem List Decreased strength;Decreased range of motion;Decreased activity tolerance;Decreased balance;Decreased mobility;Decreased coordination;Decreased knowledge of use of DME;Pain  PT Treatment Interventions DME instruction;Gait training;Stair training;Functional mobility training;Therapeutic activities;Therapeutic exercise;Balance training;Neuromuscular re-education;Patient/family education   PT Goals (Current goals can be found in the Care Plan section) Acute Rehab PT Goals Patient Stated Goal: return home PT Goal Formulation: With patient Time For Goal Achievement: 07/04/16 Potential to Achieve Goals: Good    Frequency 7X/week   Barriers to discharge        Co-evaluation               End of Session Equipment Utilized During Treatment: Gait belt Activity Tolerance: Patient limited by pain Patient left: in chair;with call bell/phone within reach Nurse Communication: Mobility status         Time: [id number] PT Time Calculation (min) (ACUTE ONLY): 18 min   Charges:   PT Evaluation $PT Eval Low Complexity: 1 Procedure     PT G CodesClearnce Sorrel  06/27/2016, 11:01 AM Sherie Don, PT, DPT [phone]

## 2016-06-27 NOTE — Progress Notes (Signed)
Orthopedic Tech Progress Note Patient Details:  Rhonda Gibbs 07-28-1971 [id number]  Patient ID: Rhonda Gibbs, female   DOB: [birth year], 45 y.o.   MRN: [id number] Placed pt in cpm 0-60  Karolee Stamps 06/27/2016, 6:19 AM

## 2016-06-27 NOTE — Care Management Note (Signed)
Case Management Note  Patient Details  Name: Rhonda Gibbs MRN: [id number] Date of Birth: 21-Nov-1971  Subjective/Objective:  45 yr old female s/p left total knee arthroplasty.                  Action/Plan: Case manager spoke with patient conncerning Home Health and DME needs. Patient has [employer];s [employer] coverage, CM spoke with Denice Paradise, from [employer], Authorization was provided to arrange home health PT through [facility]. Auth# : [id number] authorization [id number]. Case manager called referral to Marcy Salvo, [place] [facility]. Patient states she has rolling walker at the home. CM called [facility] and left message with Anderson Malta M.that patient will need 3in1 delivered to her home. Patient will have family support at discharge.    Expected Discharge Date:    06/27/16             Expected Discharge Plan:  [place]  In-House Referral:  NA  Discharge planning Services  CM Consult  Post Acute Care Choice:  Durable Medical Equipment, Home Health Choice offered to:  Patient  DME Arranged:  3-N-1 DME Agency:   ([facility] to arrange )  HH Arranged:  PT [place]:  [place]  Status of Service:  Completed, signed off  If discussed at [place] of Stay Meetings, dates discussed:    Additional Comments:  Ninfa Meeker, RN 06/27/2016, 2:14 PM

## 2016-06-27 NOTE — Progress Notes (Signed)
Subjective: 1 Day Post-Op Procedure(s) (LRB): TOTAL KNEE ARTHROPLASTY (Left) Patient reports pain as moderate.  Pt reports nausea yesterday.  None today.  She also reports lightheadedness when standing.  No chest pain/sob.  Negative flatus.  Tolerating diet.  Objective: Vital signs in last 24 hours: Temp:  [98.4 F (36.9 C)-100.2 F (37.9 C)] 100.2 F (37.9 C) (07/27 0610) Pulse Rate:  [55-95] 95 (07/27 0610) Resp:  [9-36] 16 (07/27 0610) BP: (95-189)/(58-98) 134/68 (07/27 0610) SpO2:  [92 %-100 %] 100 % (07/27 0610) Weight:  [85.7 kg (189 lb)] 85.7 kg (189 lb) (07/26 0813)  Intake/Output from previous day: 07/26 0701 - 07/27 0700 In: 2303.3 [I.V.:2103.3; IV Piggyback:200] Out: 50 [Blood:50] Intake/Output this shift: No intake/output data recorded.   Recent Labs  06/27/16 0549  HGB 11.5*    Recent Labs  06/27/16 0549  WBC 10.0  RBC 3.63*  HCT 34.3*  PLT 232    Recent Labs  06/27/16 0549  NA 135  K 3.6  CL 102  CO2 28  BUN 7  CREATININE 0.88  GLUCOSE 121*  CALCIUM 8.5*   No results for input(s): LABPT, INR in the last 72 hours.  Neurologically intact Neurovascular intact Sensation intact distally Intact pulses distally Dorsiflexion/Plantar flexion intact Incision: dressing C/D/I No cellulitis present Compartment soft  Assessment/Plan: 1 Day Post-Op Procedure(s) (LRB): TOTAL KNEE ARTHROPLASTY (Left) Advance diet Up with therapy D/C IV fluids Discharge home with home health today if she works well with PT and pain is under control ABLA-mild and stable Dry dressing change prn If d/c today, please remove bandage and replace with Aquacel (in room)  Fannie Knee 06/27/2016, 7:24 AM

## 2016-06-27 NOTE — Evaluation (Signed)
Occupational Therapy Evaluation and Discharge Patient Details Name: Rhonda Gibbs MRN: [id number] DOB: 1971/09/08 Today's Date: 06/27/2016    History of Present Illness Pt is a 45 y/o female s/p L TKR. PMH including but not limited to HTN, L knee arthroscopy with lateral release (2016).   Clinical Impression   PTA Pt was independent in ADL and IADL. Pt with OT deficits listed below. Pt able to ambulate into bathroom and perform toileting (clothing manipulation and peri care) as well as grooming tasks standing at the sink. Pt refused therapist assistance several times during session stating "I can do it myself". Pt will have 24 hour supervision and care from family. Pt does not have access to a shower on the first floor of her room, but insists she will bath using sponge bath technique. Pt provided education for LB dressing and bathing. Pt demonstrated adequate ability and safety during ADL and Pt/family have received education for compensation. No OT follow up required.     Follow Up Recommendations  No OT follow up    Equipment Recommendations  3 in 1 bedside comode    Recommendations for Other Services       Precautions / Restrictions Precautions Precautions: Knee;Fall Precaution Comments: PT discussed precautions and positioning following TKR surgery with pt. Restrictions Weight Bearing Restrictions: Yes LLE Weight Bearing: Weight bearing as tolerated      Mobility Bed Mobility Overal bed mobility: Needs Assistance Bed Mobility: Supine to Sit     Supine to sit: Supervision;HOB elevated     General bed mobility comments: Pt sitting in chair upon OT entry to room  Transfers Overall transfer level: Needs assistance Equipment used: Rolling walker (2 wheeled) Transfers: Sit to/from Stand Sit to Stand: Min guard Stand pivot transfers: Min guard       General transfer comment: Pt required increased time during transtions sit/stand. Pt refused assistance stating "I  can do it myself"    Balance Overall balance assessment: Needs assistance Sitting-balance support: Feet supported Sitting balance-Leahy Scale: Fair       Standing balance-Leahy Scale: Poor                              ADL Overall ADL's : Needs assistance/impaired Eating/Feeding: Set up;Sitting   Grooming: Wash/dry hands;Min guard;Standing Grooming Details (indicate cue type and reason): Pt expressed several times "I can do it myself" Upper Body Bathing: Set up;Sitting   Lower Body Bathing: Sitting/lateral leans;Moderate assistance;With caregiver independent assisting   Upper Body Dressing : Set up;Sitting   Lower Body Dressing: Moderate assistance;With caregiver independent assisting;Sitting/lateral leans Lower Body Dressing Details (indicate cue type and reason): Pt able to don/doff sock on RLE, and able to doff sock on LLE Pt and family provided with education on dressing sequence with respect to LLE Toilet Transfer: Set up;Grab bars;Comfort height toilet;RW (Additional time required)   Toileting- Clothing Manipulation and Hygiene: Supervision/safety;Sitting/lateral lean       Functional mobility during ADLs: Min guard General ADL Comments: Pt adament during session that "I can do it." Pt's family stated that they will be able to provided 24 hour care and assistance as needed upon d/c. Pt provided with LB dressing/bathing education as well as tranfer education.     Vision     Perception     Praxis      Pertinent Vitals/Pain Pain Assessment: 0-10 Pain Score: 9  Pain Location: L knee Pain Descriptors / Indicators: Discomfort;Grimacing;Guarding;Penetrating;Shooting;Other (Comment)  Pain Intervention(s): Limited activity within patient's tolerance;Monitored during session;Repositioned;Patient requesting pain meds-RN notified     Hand Dominance Right   Extremity/Trunk Assessment Upper Extremity Assessment Upper Extremity Assessment: Overall WFL for tasks  assessed   Lower Extremity Assessment Lower Extremity Assessment: Defer to PT evaluation LLE Deficits / Details: Pt with decreased strength and ROM limitations secondary to post-op. Sensation grossly intact. LLE: Unable to fully assess due to pain       Communication Communication Communication: No difficulties   Cognition Arousal/Alertness: Lethargic Behavior During Therapy: WFL for tasks assessed/performed;Agitated Overall Cognitive Status: Within Functional Limits for tasks assessed                     General Comments       Exercises      Shoulder Instructions      Home Living Family/patient expects to be discharged to:: Private residence Living Arrangements: Other relatives;Spouse/significant other Available Help at Discharge: Family;Available 24 hours/day Type of Home: House Home Access: Level entry     Home Layout: Multi-level;Able to live on main level with bedroom/bathroom;Other (Comment) (ha;f bath only on first level)     Bathroom Shower/Tub: Tub/shower unit Shower/tub characteristics: Architectural technologist: Standard Bathroom Accessibility: Yes How Accessible: Accessible via walker Home Equipment: [place] - 4 wheels;Cane - single point;Other (comment) (says that someone is ordering her equipment through the [place])          Prior Functioning/Environment Level of Independence: Independent             OT Diagnosis: Generalized weakness;Acute pain   OT Problem List: Decreased strength;Pain   OT Treatment/Interventions:      OT Goals(Current goals can be found in the care plan section) Acute Rehab OT Goals Patient Stated Goal: To go home OT Goal Formulation: With patient/family Time For Goal Achievement: 07/04/16 Potential to Achieve Goals: Good  OT Frequency:     Barriers to D/C:            Co-evaluation              End of Session Equipment Utilized During Treatment: Rolling walker Nurse Communication: Patient requests  pain meds  Activity Tolerance: Patient limited by pain Patient left: in chair;with call bell/phone within reach;with family/visitor present;with nursing/sitter in room   Time: 1140-1206 OT Time Calculation (min): 26 min Charges:  OT General Charges $OT Visit: 1 Procedure OT Evaluation $OT Eval Low Complexity: 1 Procedure OT Treatments $Self Care/Home Management : 8-22 mins G-Codes:    Merri Ray  OTR/L 06/27/2016, 12:28 PM [phone]

## 2016-06-27 NOTE — Progress Notes (Signed)
Physical Therapy Treatment Patient Details Name: Rhonda Gibbs MRN: [id number] DOB: [birth year] Today's Date: 06/27/2016    History of Present Illness Pt is a 45 y/o female s/p L TKR. PMH including but not limited to HTN, L knee arthroscopy with lateral release (2016).    PT Comments    Pt presented sitting upright in recliner with family/visitors present in room. Pt initially refusing therapy; however, after some encouragement, pt agreeable to demonstrating ambulation only. Pt refused further participation in therapeutic exercises at this time. Pt also refused any physical assistance with functional mobility or use of gait belt for safety. Pt would continue to benefit from skilled physical therapy services at this time while admitted and after d/c to address her limitations in order to improve her overall safety and independence with functional mobility.   Follow Up Recommendations  Home health PT;Supervision for mobility/OOB     Equipment Recommendations  Other (comment) (pt stated that she has all necessary equipment at home)    Recommendations for Other Services       Precautions / Restrictions Precautions Precautions: Knee;Fall Precaution Comments: PT discussed precautions and positioning following TKR surgery with pt. Restrictions Weight Bearing Restrictions: Yes LLE Weight Bearing: Weight bearing as tolerated    Mobility  Bed Mobility Overal bed mobility: Needs Assistance Bed Mobility: Sit to Supine     Supine to sit: Supervision;HOB elevated Sit to supine: Supervision   General bed mobility comments: Pt required increased time to complete task  Transfers Overall transfer level: Needs assistance Equipment used: Rolling walker (2 wheeled) Transfers: Sit to/from Stand Sit to Stand: Min guard Stand pivot transfers: Min guard       General transfer comment: Pt required increased time during transtions sit/stand. Pt refused assistance stating "I can do it  myself"  Ambulation/Gait Ambulation/Gait assistance: Supervision Ambulation Distance (Feet): 20 Feet Assistive device: Rolling walker (2 wheeled) Gait Pattern/deviations: Step-to pattern;Decreased step length - right;Decreased stance time - left;Decreased weight shift to left;Antalgic Gait velocity: decreased Gait velocity interpretation: Below normal speed for age/gender General Gait Details: Unable to assess as pt refused ambulation at this time secondary to pain.   Stairs            Wheelchair Mobility    Modified Rankin (Stroke Patients Only)       Balance Overall balance assessment: Needs assistance Sitting-balance support: Feet supported;No upper extremity supported Sitting balance-Leahy Scale: Fair     Standing balance support: Bilateral upper extremity supported Standing balance-Leahy Scale: Poor                      Cognition Arousal/Alertness: Awake/alert Behavior During Therapy: WFL for tasks assessed/performed;Agitated Overall Cognitive Status: Within Functional Limits for tasks assessed                      Exercises Total Joint Exercises Ankle Circles/Pumps: AROM;Strengthening;Left;10 reps;Seated Goniometric ROM: Flexion: 64 degrees, Extension: lacking 20 degrees to neutral  (seated)    General Comments        Pertinent Vitals/Pain Pain Assessment: 0-10 Pain Score: 8  Pain Location: L knee Pain Descriptors / Indicators: Discomfort;Grimacing;Guarding;Moaning;Operative site guarding Pain Intervention(s): Limited activity within patient's tolerance;Monitored during session;Repositioned    Home Living Family/patient expects to be discharged to:: Private residence Living Arrangements: Other relatives;Spouse/significant other Available Help at Discharge: Family;Available 24 hours/day Type of Home: House Home Access: Level entry   Home Layout: Multi-level;Able to live on main level with bedroom/bathroom;Other (Comment) (ha;f bath  only on first level) Home Equipment: Walker - 4 wheels;Cane - single point;Other (comment) (says that someone is ordering her equipment through the [place])      Prior Function Level of Independence: Independent          PT Goals (current goals can now be found in the care plan section) Acute Rehab PT Goals Patient Stated Goal: return home PT Goal Formulation: With patient Time For Goal Achievement: 07/04/16 Potential to Achieve Goals: Good Progress towards PT goals: Progressing toward goals    Frequency  7X/week    PT Plan Current plan remains appropriate    Co-evaluation             End of Session Equipment Utilized During Treatment: Other (comment) (pt refused any assistance or gait belt during mobility) Activity Tolerance: Patient limited by pain Patient left: in bed;with call bell/phone within reach;with family/visitor present     Time: [id number] PT Time Calculation (min) (ACUTE ONLY): 12 min  Charges:  $Gait Training: 8-22 mins                    G CodesClearnce Sorrel  07/17/16, 1:00 PM Sherie Don, [place], DPT [phone]

## 2016-08-30 ENCOUNTER — Other Ambulatory Visit: Payer: Self-pay | Admitting: Orthopaedic Surgery

## 2016-08-30 DIAGNOSIS — M5136 Other intervertebral disc degeneration, lumbar region: Secondary | ICD-10-CM

## 2017-01-29 ENCOUNTER — Encounter (HOSPITAL_BASED_OUTPATIENT_CLINIC_OR_DEPARTMENT_OTHER): Payer: Self-pay | Admitting: *Deleted

## 2017-01-29 NOTE — Progress Notes (Signed)
Patient states she has never used CPAP for her OSA. States she just completed a home sleep test ordered by the [place] in [place] on 01-28-17. Was told the results would not be back for 3 wks. She will come in for BMET.

## 2017-01-30 ENCOUNTER — Encounter (HOSPITAL_BASED_OUTPATIENT_CLINIC_OR_DEPARTMENT_OTHER)
Admission: RE | Admit: 2017-01-30 | Discharge: 2017-01-30 | Disposition: A | Discharge status: Home / Self Care | Payer: No Typology Code available for payment source | Source: Ambulatory Visit | Attending: Orthopedic Surgery | Admitting: Orthopedic Surgery

## 2017-01-30 DIAGNOSIS — Z01812 Encounter for preprocedural laboratory examination: Secondary | ICD-10-CM | POA: Insufficient documentation

## 2017-01-30 LAB — BASIC METABOLIC PANEL
ANION GAP: 7 (ref 5–15)
BUN: 8 mg/dL (ref 6–20)
CO2: 26 mmol/L (ref 22–32)
Calcium: 9.3 mg/dL (ref 8.9–10.3)
Chloride: 107 mmol/L (ref 101–111)
Creatinine, Ser: 0.71 mg/dL (ref 0.44–1.00)
GFR calc Af Amer: >60 mL/min (ref >60)
GFR calc non Af Amer: >60 mL/min (ref >60)
Glucose, Bld: 88 mg/dL (ref 65–99)
POTASSIUM: 3.8 mmol/L (ref 3.5–5.1)
SODIUM: 140 mmol/L (ref 135–145)

## 2017-02-03 NOTE — H&P (Signed)
Rhonda Gibbs comes in for follow up.  She has been working aggressively in therapy.  Made initial advances after manipulation, but has now pretty much plateaued.  Initially surgery in July.  Virtually no therapy at all for the first two months.  Manipulation by me on September 30, 2016.  Treatment and previous intervention reviewed in detail.  When I manipulated her she had profound adhesion.  History and general exam are reviewed.   EXAMINATION: Specifically, her motion is 5-95.  Her strength is a little bit better.  Still an awkward gait.   X-RAYS: Three view x-ray shows good seating and alignment of all of her components.    DISPOSITION:  I talked with Olin Hauser at some length.  The improvements we got after manipulation are definitely present, as she had a range of motion of 20-60 degrees before that.  We have now plateaued.  We are going to add a second manipulation with this plan: exam under anesthesia, manipulation.  If I can get full motion with just that I am going to stop there and have her resume therapy.  If not, I am going to add arthroscopy with extensive lysis and debridement of adhesions and then resume therapy.  Both of these approaches discussed.  Paperwork complete.  All questions answered.  More than 30 minutes spent face-to-face covering this with her.  I will see her at the time of operative intervention.

## 2017-02-06 ENCOUNTER — Encounter (HOSPITAL_BASED_OUTPATIENT_CLINIC_OR_DEPARTMENT_OTHER)
Admission: RE | Disposition: A | Discharge status: Home / Self Care | Payer: Self-pay | Source: Ambulatory Visit | Attending: Orthopedic Surgery

## 2017-02-06 ENCOUNTER — Encounter (HOSPITAL_BASED_OUTPATIENT_CLINIC_OR_DEPARTMENT_OTHER): Payer: Self-pay | Admitting: Anesthesiology

## 2017-02-06 ENCOUNTER — Ambulatory Visit (HOSPITAL_BASED_OUTPATIENT_CLINIC_OR_DEPARTMENT_OTHER)
Admission: RE | Admit: 2017-02-06 | Discharge: 2017-02-06 | Disposition: A | Discharge status: Home / Self Care | Payer: No Typology Code available for payment source | Source: Ambulatory Visit | Attending: Orthopedic Surgery | Admitting: Orthopedic Surgery

## 2017-02-06 ENCOUNTER — Ambulatory Visit (HOSPITAL_BASED_OUTPATIENT_CLINIC_OR_DEPARTMENT_OTHER): Payer: No Typology Code available for payment source | Admitting: Anesthesiology

## 2017-02-06 DIAGNOSIS — Z96652 Presence of left artificial knee joint: Secondary | ICD-10-CM | POA: Insufficient documentation

## 2017-02-06 DIAGNOSIS — I10 Essential (primary) hypertension: Secondary | ICD-10-CM | POA: Insufficient documentation

## 2017-02-06 DIAGNOSIS — M24662 Ankylosis, left knee: Secondary | ICD-10-CM | POA: Insufficient documentation

## 2017-02-06 DIAGNOSIS — Z79899 Other long term (current) drug therapy: Secondary | ICD-10-CM | POA: Insufficient documentation

## 2017-02-06 HISTORY — DX: Obstructive sleep apnea (adult) (pediatric): G47.33

## 2017-02-06 HISTORY — PX: CLOSED MANIPULATION KNEE WITH STERIOD INJECTION: SHX5610

## 2017-02-06 SURGERY — CLOSED MANIPULATION KNEE WITH STEROID INJECTION
Anesthesia: Regional | Site: Knee | Laterality: Left

## 2017-02-06 MED ORDER — OXYCODONE HCL 5 MG PO TABS
5.0000 mg | ORAL_TABLET | Freq: Once | ORAL | Status: AC | PRN
  Administered 2017-02-06: 5 mg via ORAL

## 2017-02-06 MED ORDER — LIDOCAINE HCL (CARDIAC) 20 MG/ML IV SOLN
INTRAVENOUS | Status: DC | PRN
  Administered 2017-02-06: 50 mg via INTRAVENOUS

## 2017-02-06 MED ORDER — CHLORHEXIDINE GLUCONATE 4 % EX LIQD
60.0000 mL | Freq: Once | CUTANEOUS | Status: DC
Start: 2017-02-06 — End: 2017-02-06

## 2017-02-06 MED ORDER — LIDOCAINE 2% (20 MG/ML) 5 ML SYRINGE
INTRAMUSCULAR | Status: AC
  Filled 2017-02-06: qty 5

## 2017-02-06 MED ORDER — MORPHINE SULFATE (PF) 4 MG/ML IV SOLN
INTRAVENOUS | Status: AC
  Filled 2017-02-06: qty 1

## 2017-02-06 MED ORDER — PROPOFOL 500 MG/50ML IV EMUL
INTRAVENOUS | Status: AC
  Filled 2017-02-06: qty 50

## 2017-02-06 MED ORDER — OXYCODONE HCL 5 MG/5ML PO SOLN: 5.0000 mg | Freq: Once | ORAL | Status: AC | PRN

## 2017-02-06 MED ORDER — FENTANYL CITRATE (PF) 100 MCG/2ML IJ SOLN
INTRAMUSCULAR | Status: AC
  Filled 2017-02-06: qty 2

## 2017-02-06 MED ORDER — DEXAMETHASONE SODIUM PHOSPHATE 10 MG/ML IJ SOLN
INTRAMUSCULAR | Status: AC
  Filled 2017-02-06: qty 1

## 2017-02-06 MED ORDER — MIDAZOLAM HCL 2 MG/2ML IJ SOLN
INTRAMUSCULAR | Status: AC
  Filled 2017-02-06: qty 2

## 2017-02-06 MED ORDER — CEFAZOLIN SODIUM-DEXTROSE 2-4 GM/100ML-% IV SOLN
INTRAVENOUS | Status: AC
  Filled 2017-02-06: qty 100

## 2017-02-06 MED ORDER — BUPIVACAINE HCL (PF) 0.5 % IJ SOLN
INTRAMUSCULAR | Status: DC | PRN
  Administered 2017-02-06: 6 mL

## 2017-02-06 MED ORDER — ONDANSETRON HCL 4 MG/2ML IJ SOLN
INTRAMUSCULAR | Status: AC
  Filled 2017-02-06: qty 2

## 2017-02-06 MED ORDER — OXYCODONE HCL 5 MG PO TABS
ORAL_TABLET | ORAL | Status: AC
  Filled 2017-02-06: qty 1

## 2017-02-06 MED ORDER — SCOPOLAMINE 1 MG/3DAYS TD PT72: 1.0000 | MEDICATED_PATCH | Freq: Once | TRANSDERMAL | Status: DC | PRN

## 2017-02-06 MED ORDER — LACTATED RINGERS IV SOLN
INTRAVENOUS | Status: DC
Start: 2017-02-06 — End: 2017-02-06

## 2017-02-06 MED ORDER — CHLORHEXIDINE GLUCONATE 4 % EX LIQD: 60.0000 mL | Freq: Once | CUTANEOUS | Status: DC

## 2017-02-06 MED ORDER — DEXAMETHASONE SODIUM PHOSPHATE 4 MG/ML IJ SOLN
INTRAMUSCULAR | Status: DC | PRN
  Administered 2017-02-06: 10 mg via INTRAVENOUS

## 2017-02-06 MED ORDER — FENTANYL CITRATE (PF) 100 MCG/2ML IJ SOLN
25.0000 ug | INTRAMUSCULAR | Status: DC | PRN
  Administered 2017-02-06 (×3): 50 ug via INTRAVENOUS

## 2017-02-06 MED ORDER — FENTANYL CITRATE (PF) 100 MCG/2ML IJ SOLN
50.0000 ug | INTRAMUSCULAR | Status: DC | PRN
  Administered 2017-02-06: 100 ug via INTRAVENOUS
  Administered 2017-02-06: 50 ug via INTRAVENOUS

## 2017-02-06 MED ORDER — METHYLPREDNISOLONE ACETATE 80 MG/ML IJ SUSP
INTRAMUSCULAR | Status: DC | PRN
  Administered 2017-02-06: 80 mg

## 2017-02-06 MED ORDER — LACTATED RINGERS IV SOLN
INTRAVENOUS | Status: DC
  Administered 2017-02-06: 07:00:00 via INTRAVENOUS

## 2017-02-06 MED ORDER — LACTATED RINGERS IV SOLN: INTRAVENOUS | Status: DC

## 2017-02-06 MED ORDER — ONDANSETRON HCL 4 MG/2ML IJ SOLN: 4.0000 mg | Freq: Once | INTRAMUSCULAR | Status: DC | PRN

## 2017-02-06 MED ORDER — PROPOFOL 10 MG/ML IV BOLUS
INTRAVENOUS | Status: DC | PRN
  Administered 2017-02-06: 200 mg via INTRAVENOUS

## 2017-02-06 MED ORDER — MIDAZOLAM HCL 2 MG/2ML IJ SOLN
1.0000 mg | INTRAMUSCULAR | Status: DC | PRN
  Administered 2017-02-06: 1 mg via INTRAVENOUS
  Administered 2017-02-06: 2 mg via INTRAVENOUS

## 2017-02-06 MED ORDER — CEFAZOLIN SODIUM-DEXTROSE 2-4 GM/100ML-% IV SOLN
2.0000 g | INTRAVENOUS | Status: AC
  Administered 2017-02-06: 2 g via INTRAVENOUS

## 2017-02-06 SURGICAL SUPPLY — 37 items
BANDAGE ACE 6X5 VEL STRL LF (GAUZE/BANDAGES/DRESSINGS) ×2 IMPLANT
BLADE CUDA 5.5 (BLADE) IMPLANT
BLADE CUDA GRT WHITE 3.5 (BLADE) IMPLANT
BLADE CUTTER GATOR 3.5 (BLADE) ×2 IMPLANT
BLADE CUTTER MENIS 5.5 (BLADE) IMPLANT
BLADE GREAT WHITE 4.2 (BLADE) ×2 IMPLANT
BUR OVAL 4.0 (BURR) IMPLANT
CUTTER MENISCUS  4.2MM (BLADE)
CUTTER MENISCUS 4.2MM (BLADE) IMPLANT
DRAPE ARTHROSCOPY W/POUCH 90 (DRAPES) ×2 IMPLANT
DURAPREP 26ML APPLICATOR (WOUND CARE) ×2 IMPLANT
ELECT MENISCUS 165MM 90D (ELECTRODE) IMPLANT
ELECT REM PT RETURN 9FT ADLT (ELECTROSURGICAL)
ELECTRODE REM PT RTRN 9FT ADLT (ELECTROSURGICAL) IMPLANT
GAUZE SPONGE 4X4 12PLY STRL (GAUZE/BANDAGES/DRESSINGS) ×2 IMPLANT
GAUZE XEROFORM 1X8 LF (GAUZE/BANDAGES/DRESSINGS) ×2 IMPLANT
GLOVE BIOGEL PI IND STRL 7.0 (GLOVE) ×1 IMPLANT
GLOVE BIOGEL PI INDICATOR 7.0 (GLOVE) ×1
GLOVE ECLIPSE 7.0 STRL STRAW (GLOVE) ×2 IMPLANT
GLOVE SURG ORTHO 8.0 STRL STRW (GLOVE) ×2 IMPLANT
GOWN STRL REUS W/ TWL LRG LVL3 (GOWN DISPOSABLE) ×1 IMPLANT
GOWN STRL REUS W/ TWL XL LVL3 (GOWN DISPOSABLE) ×2 IMPLANT
GOWN STRL REUS W/TWL LRG LVL3 (GOWN DISPOSABLE) ×2
GOWN STRL REUS W/TWL XL LVL3 (GOWN DISPOSABLE) ×6 IMPLANT
HOLDER KNEE FOAM BLUE (MISCELLANEOUS) ×2 IMPLANT
IV NS IRRIG 3000ML ARTHROMATIC (IV SOLUTION) ×4 IMPLANT
KNEE WRAP E Z 3 GEL PACK (MISCELLANEOUS) ×2 IMPLANT
MANIFOLD NEPTUNE II (INSTRUMENTS) ×2 IMPLANT
PACK ARTHROSCOPY DSU (CUSTOM PROCEDURE TRAY) ×2 IMPLANT
PACK BASIN DAY SURGERY FS (CUSTOM PROCEDURE TRAY) ×2 IMPLANT
PENCIL BUTTON HOLSTER BLD 10FT (ELECTRODE) IMPLANT
SET ARTHROSCOPY TUBING (MISCELLANEOUS) ×2
SET ARTHROSCOPY TUBING LN (MISCELLANEOUS) ×1 IMPLANT
SUT ETHILON 3 0 PS 1 (SUTURE) ×2 IMPLANT
SUT VIC AB 3-0 FS2 27 (SUTURE) IMPLANT
TOWEL OR 17X24 6PK STRL BLUE (TOWEL DISPOSABLE) ×2 IMPLANT
WATER STERILE IRR 1000ML POUR (IV SOLUTION) ×2 IMPLANT

## 2017-02-06 NOTE — Anesthesia Procedure Notes (Signed)
Anesthesia Regional Block: Adductor canal block   Pre-Anesthetic Checklist: ,, timeout performed, Correct Patient, Correct Site, Correct Laterality, Correct Procedure, Correct Position, site marked, Risks and benefits discussed,  Surgical consent,  Pre-op evaluation,  At surgeon's request and post-op pain management  Laterality: Left  Prep: Maximum Sterile Barrier Precautions used, chloraprep       Needles:   Needle Type: Echogenic Stimulator Needle          Additional Needles:   Procedures: ultrasound guided,,,,,,,,  Narrative:  Start time: 02/06/2017 7:15 AM End time: 02/06/2017 7:20 AM Injection made incrementally with aspirations every 5 mL.  Performed by: Personally  Anesthesiologist: Linna Caprice, 

## 2017-02-06 NOTE — Discharge Instructions (Signed)
Weight bearing as tolerated.  May apply ice for up to 20 min at a time for pain and swelling.  Start physical therapy tomorrow!  Fu appt in one week   Call your surgeon if you experience:   1.  Fever over 101.0. 2.  Inability to urinate. 3.  Nausea and/or vomiting. 4.  Extreme swelling or bruising at the surgical site. 5.  Continued bleeding from the incision. 6.  Increased pain, redness or drainage from the incision. 7.  Problems related to your pain medication. 8.  Any problems and/or concerns   Post Anesthesia Home Care Instructions  Activity: Get plenty of rest for the remainder of the day. A responsible adult should stay with you for 24 hours following the procedure.  For the next 24 hours, DO NOT: -Drive a car -Paediatric nurse -Drink alcoholic beverages -Take any medication unless instructed by your physician -Make any legal decisions or sign important papers.  Meals: Start with liquid foods such as gelatin or soup. Progress to regular foods as tolerated. Avoid greasy, spicy, heavy foods. If nausea and/or vomiting occur, drink only clear liquids until the nausea and/or vomiting subsides. Call your physician if vomiting continues.  Special Instructions/Symptoms: Your throat may feel dry or sore from the anesthesia or the breathing tube placed in your throat during surgery. If this causes discomfort, gargle with warm salt water. The discomfort should disappear within 24 hours.  If you had a scopolamine patch placed behind your ear for the management of post- operative nausea and/or vomiting:  1. The medication in the patch is effective for 72 hours, after which it should be removed.  Wrap patch in a tissue and discard in the trash. Wash hands thoroughly with soap and water. 2. You may remove the patch earlier than 72 hours if you experience unpleasant side effects which may include dry mouth, dizziness or visual disturbances. 3. Avoid touching the patch. Wash your hands with  soap and water after contact with the patch.    Regional Anesthesia Blocks  1. Numbness or the inability to move the "blocked" extremity may last from 3-48 hours after placement. The length of time depends on the medication injected and your individual response to the medication. If the numbness is not going away after 48 hours, call your surgeon.  2. The extremity that is blocked will need to be protected until the numbness is gone and the  Strength has returned. Because you cannot feel it, you will need to take extra care to avoid injury. Because it may be weak, you may have difficulty moving it or using it. You may not know what position it is in without looking at it while the block is in effect.  3. For blocks in the legs and feet, returning to weight bearing and walking needs to be done carefully. You will need to wait until the numbness is entirely gone and the strength has returned. You should be able to move your leg and foot normally before you try and bear weight or walk. You will need someone to be with you when you first try to ensure you do not fall and possibly risk injury.  4. Bruising and tenderness at the needle site are common side effects and will resolve in a few days.  5. Persistent numbness or new problems with movement should be communicated to the surgeon or the [facility] [phone] [place] [phone]).

## 2017-02-06 NOTE — Op Note (Signed)
NAME:  JACQUITA, MULHEARN             ACCOUNT NO.:  [id number]  MEDICAL RECORD NO.:  [id number]  LOCATION:                                 FACILITY:  PHYSICIAN:  [name], M.D.      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  PROCEDURE:  Left knee exam, manipulation under anesthesia with intraarticular injection of cortisone.  SURGEON:  [name], M.D.  ASSISTANT:  Elmyra Ricks, PA.  ANESTHESIA:  General.  PROCEDURE IN DETAIL:  The patient was brought to the operating room, and after adequate anesthesia had been obtained, the left knee examined. Motion 10 to 90.  With gentle manipulation, I was able to achieve full extension, even slight hyperextension, and flexion between 125 and 130 degrees.  Based on this, open intervention not indicated.  Under sterile technique, injected intra-articularly with Depo-Medrol and Marcaine. Anesthesia reversed.  Brought to the recovery room.  Tolerated the surgery well.  No complications.     [name], M.D.   ______________________________ [name], M.D.    DFM/MEDQ  D:  02/06/2017  T:  02/06/2017  Job:  [id number]

## 2017-02-06 NOTE — Anesthesia Procedure Notes (Signed)
Procedure Name: LMA Insertion Performed by: ,  W Pre-anesthesia Checklist: Patient identified, Emergency Drugs available, Suction available and Patient being monitored Patient Re-evaluated:Patient Re-evaluated prior to inductionOxygen Delivery Method: Circle system utilized Preoxygenation: Pre-oxygenation with 100% oxygen Intubation Type: IV induction Ventilation: Mask ventilation without difficulty LMA: LMA inserted LMA Size: 4.0 Number of attempts: 1 Placement Confirmation: positive ETCO2 Tube secured with: Tape Dental Injury: Teeth and Oropharynx as per pre-operative assessment        

## 2017-02-06 NOTE — Anesthesia Postprocedure Evaluation (Addendum)
Anesthesia Post Note  Patient: Rhonda Gibbs  Procedure(s) Performed: Procedure(s) (LRB): MANIPULATION (Left)  Patient location during evaluation: PACU Anesthesia Type: Regional Level of consciousness: awake, awake and alert and oriented Pain management: pain level controlled Vital Signs Assessment: post-procedure vital signs reviewed and stable Respiratory status: spontaneous breathing, nonlabored ventilation and respiratory function stable Cardiovascular status: blood pressure returned to baseline Anesthetic complications: no       Last Vitals:  Vitals:   02/06/17 0910 02/06/17 0929  BP:  (!) 150/93  Pulse: 76 83  Resp: 11 16  Temp:  36.5 C    Last Pain:  Vitals:   02/06/17 0929  TempSrc: Oral  PainSc: 7                  , COKER

## 2017-02-06 NOTE — Transfer of Care (Signed)
Immediate Anesthesia Transfer of Care Note  Patient: Rhonda Gibbs  Procedure(s) Performed: Procedure(s): MANIPULATION (Left)  Patient Location: PACU  Anesthesia Type:General  Level of Consciousness: awake and sedated  Airway & Oxygen Therapy: Patient Spontanous Breathing and Patient connected to face mask oxygen  Post-op Assessment: Report given to RN and Post -op Vital signs reviewed and stable  Post vital signs: Reviewed and stable  Last Vitals:  Vitals:   02/06/17 0725 02/06/17 0726  BP:    Pulse: 84 99  Resp: 14 19  Temp:      Last Pain:  Vitals:   02/06/17 0649  TempSrc: Oral  PainSc: 8       Patients Stated Pain Goal: 4 (68/12/75 1700)  Complications: No apparent anesthesia complications

## 2017-02-06 NOTE — Progress Notes (Signed)
AssistedDr. Joslin with left, ultrasound guided, adductor canal block. Side rails up, monitors on throughout procedure. See vital signs in flow sheet. Tolerated Procedure well.  

## 2017-02-06 NOTE — Anesthesia Preprocedure Evaluation (Addendum)
Anesthesia Evaluation  Patient identified by MRN, date of birth, ID band Patient awake    Reviewed: Allergy & Precautions, NPO status , Patient's Chart, lab work & pertinent test results  Airway Mallampati: II  TM Distance: >3 FB Neck ROM: Full    Dental  (+) Teeth Intact, Dental Advisory Given   Pulmonary    breath sounds clear to auscultation       Cardiovascular hypertension,  Rhythm:Regular Rate:Normal     Neuro/Psych    GI/Hepatic   Endo/Other    Renal/GU      Musculoskeletal   Abdominal   Peds  Hematology   Anesthesia Other Findings   Reproductive/Obstetrics                            Anesthesia Physical Anesthesia Plan  ASA: III  Anesthesia Plan: General and Regional   Post-op Pain Management:    Induction: Intravenous  Airway Management Planned: LMA  Additional Equipment:   Intra-op Plan:   Post-operative Plan:   Informed Consent: I have reviewed the patients History and Physical, chart, labs and discussed the procedure including the risks, benefits and alternatives for the proposed anesthesia with the patient or authorized representative who has indicated his/her understanding and acceptance.   Dental advisory given  Plan Discussed with: CRNA and Anesthesiologist  Anesthesia Plan Comments:         Anesthesia Quick Evaluation

## 2017-02-06 NOTE — Interval H&P Note (Signed)
History and Physical Interval Note:  02/06/2017 7:43 AM  Rhonda Gibbs  has presented today for surgery, with the diagnosis of ANKYLOSIS, LEFT KNEE  The various methods of treatment have been discussed with the patient and family. After consideration of risks, benefits and other options for treatment, the patient has consented to  Procedure(s): LEFT KNEE ARTHROSCOPY, MANIPULATION, LYSIS OF ADHESIONS (Left) as a surgical intervention .  The patient's history has been reviewed, patient examined, no change in status, stable for surgery.  I have reviewed the patient's chart and labs.  Questions were answered to the patient's satisfaction.     [name]

## 2017-02-09 ENCOUNTER — Encounter (HOSPITAL_BASED_OUTPATIENT_CLINIC_OR_DEPARTMENT_OTHER): Payer: Self-pay | Admitting: Orthopedic Surgery

## 2017-02-10 NOTE — Addendum Note (Signed)
Addendum  created 02/10/17 1025 by Ernesta Amble , CRNA   Charge Capture section accepted

## 2017-02-11 ENCOUNTER — Encounter (HOSPITAL_BASED_OUTPATIENT_CLINIC_OR_DEPARTMENT_OTHER): Payer: Self-pay | Admitting: Orthopedic Surgery

## 2017-02-17 ENCOUNTER — Encounter (HOSPITAL_BASED_OUTPATIENT_CLINIC_OR_DEPARTMENT_OTHER): Payer: Self-pay | Admitting: Orthopedic Surgery

## 2017-02-20 NOTE — H&P (Signed)
Marbeth comes in for follow up.  She has been working aggressively in therapy.  Made initial advances after manipulation, but has now pretty much plateaued.  Initially surgery in July.  Virtually no therapy at all for the first two months.  Manipulation by me on September 30, 2016.  Treatment and previous intervention reviewed in detail.  When I manipulated her she had profound adhesion.  History and general exam are reviewed.   EXAMINATION: Well developed, well nourished female in no acute distress.  Alert and oriented x 3.  Heart sounds normal. Lungs clear to auscultation bilaterally.  Exam of left knee: Specifically, her motion is 5-95.  Stable to valgus and varus stress.  Her strength is a little bit better.  Still an awkward gait.   X-RAYS: Three view x-ray shows good seating and alignment of all of her components.    DISPOSITION:  I talked with Olin Hauser at some length.  The improvements we got after manipulation are definitely present, as she had a range of motion of 20-60 degrees before that.  We have now plateaued.  We are going to add a second manipulation with this plan: exam under anesthesia, manipulation.  If I can get full motion with just that I am going to stop there and have her resume therapy.  If not, I am going to add arthroscopy with extensive lysis and debridement of adhesions and then resume therapy.  Both of these approaches discussed.  Paperwork complete.  All questions answered.  More than 30 minutes spent face-to-face covering this with her.  I will see her at the time of operative intervention.

## 2017-04-16 ENCOUNTER — Encounter: Payer: Self-pay | Admitting: Gynecology

## 2017-07-24 NOTE — Addendum Note (Signed)
Addendum  created 07/24/17 1131 by Roberts Gaudy, MD   Sign clinical note

## 2018-07-22 ENCOUNTER — Other Ambulatory Visit (HOSPITAL_BASED_OUTPATIENT_CLINIC_OR_DEPARTMENT_OTHER): Payer: Self-pay

## 2018-07-22 DIAGNOSIS — G479 Sleep disorder, unspecified: Secondary | ICD-10-CM

## 2018-07-28 ENCOUNTER — Other Ambulatory Visit: Payer: Self-pay | Admitting: Internal Medicine

## 2018-07-28 DIAGNOSIS — Z1231 Encounter for screening mammogram for malignant neoplasm of breast: Secondary | ICD-10-CM

## 2018-08-07 ENCOUNTER — Ambulatory Visit (HOSPITAL_BASED_OUTPATIENT_CLINIC_OR_DEPARTMENT_OTHER): Payer: No Typology Code available for payment source | Attending: Internal Medicine | Admitting: Internal Medicine

## 2018-08-07 DIAGNOSIS — R0902 Hypoxemia: Secondary | ICD-10-CM | POA: Insufficient documentation

## 2018-08-07 DIAGNOSIS — G4733 Obstructive sleep apnea (adult) (pediatric): Secondary | ICD-10-CM | POA: Insufficient documentation

## 2018-08-07 DIAGNOSIS — R0683 Snoring: Secondary | ICD-10-CM | POA: Insufficient documentation

## 2018-08-07 DIAGNOSIS — G479 Sleep disorder, unspecified: Secondary | ICD-10-CM

## 2018-08-22 DIAGNOSIS — G4733 Obstructive sleep apnea (adult) (pediatric): Secondary | ICD-10-CM | POA: Diagnosis not present

## 2018-08-22 NOTE — Procedures (Signed)
   Patient Name: Rhonda Gibbs, [place] Date: 08/08/2018 Gender: Female D.O.B: 01/16/1971 Age (years): 36 Referring Provider: Rodney Langton MD Height (inches): 65 Interpreting Physician: Baird Lyons MD, ABSM Weight (lbs): 195 RPSGT: Jacolyn Reedy BMI: 32 MRN: [id number] Neck Size: 14.00  CLINICAL INFORMATION Sleep Study Type: HST Indication for sleep study: OSA  Epworth Sleepiness Score: 2  SLEEP STUDY TECHNIQUE A multi-channel overnight portable sleep study was performed. The channels recorded were: nasal airflow, thoracic respiratory movement, and oxygen saturation with a pulse oximetry. Snoring was also monitored.  MEDICATIONS Patient self administered medications include: none reported.  SLEEP ARCHITECTURE Patient was studied for 445.8 minutes. The sleep efficiency was 98.9 % and the patient was supine for 53.1%. The arousal index was 0.0 per hour.  RESPIRATORY PARAMETERS The overall AHI was 11.0 per hour, with a central apnea index of 0.0 per hour. The oxygen nadir was 67% during sleep.  CARDIAC DATA Mean heart rate during sleep was 62.7 bpm.  IMPRESSIONS - Mild obstructive sleep apnea occurred during this study (AHI = 11.0/h). - No significant central sleep apnea occurred during this study (CAI = 0.0/h). - Oxygen desaturation was noted during this study (Min O2 = 67%, Mean 88%). - Patient snored.  DIAGNOSIS - Obstructive Sleep Apnea (327.23 [G47.33 ICD-10]) - Nocturnal Hypoxemia (327.26 [G47.36 ICD-10])  RECOMMENDATIONS - Treatment for mild OSA is usually directed at symptoms. Conservative meaasures might include observation, weight loss and sleep position off back. The sustained hypoxemia is concerning, suggesting a CPAP titration sleep study. This would allow confirmation that hypoxemia was corrected by CPAP. - Be careful with alcohol, sedatives and other CNS depressants that may worsen sleep apnea and disrupt normal sleep architecture. - Sleep  hygiene should be reviewed to assess factors that may improve sleep quality. - Weight management and regular exercise should be initiated or continued.  [Electronically signed] 08/22/2018 11:52 AM  Baird Lyons MD, ABSM Diplomate, American Board of Sleep Medicine   NPI: [id number]                          [place], [place] [facility]  ELECTRONICALLY SIGNED ON:  08/22/2018, 11:47 AM [place] PH: [phone]   FX: [phone] [place]

## 2018-08-25 ENCOUNTER — Ambulatory Visit: Payer: Non-veteran care

## 2018-09-15 ENCOUNTER — Other Ambulatory Visit (HOSPITAL_BASED_OUTPATIENT_CLINIC_OR_DEPARTMENT_OTHER): Payer: Self-pay

## 2018-09-15 DIAGNOSIS — G4733 Obstructive sleep apnea (adult) (pediatric): Secondary | ICD-10-CM

## 2018-09-16 ENCOUNTER — Ambulatory Visit
Admission: RE | Admit: 2018-09-16 | Discharge: 2018-09-16 | Disposition: A | Discharge status: Home / Self Care | Payer: Non-veteran care | Source: Ambulatory Visit | Attending: Internal Medicine | Admitting: Internal Medicine

## 2018-09-16 DIAGNOSIS — Z1231 Encounter for screening mammogram for malignant neoplasm of breast: Secondary | ICD-10-CM

## 2018-10-02 ENCOUNTER — Ambulatory Visit (HOSPITAL_BASED_OUTPATIENT_CLINIC_OR_DEPARTMENT_OTHER): Payer: No Typology Code available for payment source | Attending: Internal Medicine | Admitting: Internal Medicine

## 2018-10-02 VITALS — Ht 65.0 in | Wt 195.0 lb

## 2018-10-02 DIAGNOSIS — G4733 Obstructive sleep apnea (adult) (pediatric): Secondary | ICD-10-CM | POA: Insufficient documentation

## 2018-10-11 DIAGNOSIS — G4733 Obstructive sleep apnea (adult) (pediatric): Secondary | ICD-10-CM | POA: Diagnosis not present

## 2018-10-11 NOTE — Procedures (Signed)
Patient Name: Rhonda Gibbs, Rhonda Gibbs Date: 10/02/2018 Gender: Female D.O.B: Jun 01, 1971 Age (years): 53 Referring Provider: Versie Starks MD Height (inches): 65 Interpreting Physician: Baird Lyons MD, ABSM Weight (lbs): 195 RPSGT: Lanae Boast BMI: 32 MRN: [id number] Neck Size: 14.00  CLINICAL INFORMATION The patient is referred for a CPAP titration to treat sleep apnea.  Date of NPSG, Split Night or HST  NPSG 08/08/18   AHI 11/ hr, desaturation to 67%, body weight 195 lbs.  SLEEP STUDY TECHNIQUE As per the AASM Manual for the Scoring of Sleep and Associated Events v2.3 (April 2016) with a hypopnea requiring 4% desaturations.  The channels recorded and monitored were frontal, central and occipital EEG, electrooculogram (EOG), submentalis EMG (chin), nasal and oral airflow, thoracic and abdominal wall motion, anterior tibialis EMG, snore microphone, electrocardiogram, and pulse oximetry. Continuous positive airway pressure (CPAP) was initiated at the beginning of the study and titrated to treat sleep-disordered breathing.  MEDICATIONS Medications self-administered by patient taken the night of the study : none reported  TECHNICIAN COMMENTS Comments added by technician: Patient had difficulty initiating sleep. Comments added by scorer: N/A RESPIRATORY PARAMETERS Optimal PAP Pressure (cm): 11 AHI at Optimal Pressure (/hr): 0.0 Overall Minimal O2 (%): 91.0 Supine % at Optimal Pressure (%): 0 Minimal O2 at Optimal Pressure (%): 93.0   SLEEP ARCHITECTURE The study was initiated at 9:41:25 PM and ended at 4:04:13 AM.  Sleep onset time was 70.4 minutes and the sleep efficiency was 42.1%%. The total sleep time was 161 minutes.  The patient spent 4.7%% of the night in stage N1 sleep, 73.0%% in stage N2 sleep, 22.0%% in stage N3 and 0.3% in REM.Stage REM latency was 189.5 minutes  Wake after sleep onset was 151.4. Alpha intrusion was absent. Supine sleep was  0.00%.  CARDIAC DATA The 2 lead EKG demonstrated sinus rhythm. The mean heart rate was 76.3 beats per minute. Other EKG findings include: None.  LEG MOVEMENT DATA The total Periodic Limb Movements of Sleep (PLMS) were 0. The PLMS index was 0.0. A PLMS index of <15 is considered normal in adults.  IMPRESSIONS - The optimal PAP pressure was 11 cm of water. - Central sleep apnea was not noted during this titration (CAI = 0.0/h). - Significant oxygen desaturations were not observed during this titration (min O2 = 91.0%). - The patient snored with moderate snoring volume during this titration study. - No cardiac abnormalities were observed during this study. - Clinically significant periodic limb movements were not noted during this study.  - The patient had difficulty initiating sleep, then woke after 6 hours of recording and chose to leave, saying she would not return to sleep.  DIAGNOSIS - Obstructive Sleep Apnea (327.23 [G47.33 ICD-10])  RECOMMENDATIONS - Trial of CPAP therapy on 11 cm H2O or autopap 5-15. Patient used a Small size Resmed Full Face Mask AirFit 20 for Her mask and heated humidification. -Be carefull with alcohol, sedatives and other CNS depressants that may worsen sleep apnea and disrupt normal sleep architecture. - Sleep hygiene should be reviewed to assess factors that may improve sleep quality. - Weight management and regular exercise should be initiated or continued.  [Electronically signed] 10/11/2018 03:11 PM  Baird Lyons MD, Standard, American Board of Sleep Medicine   NPI: [id number]                          [place], [place] [facility]  ELECTRONICALLY SIGNED ON:  10/11/2018, 3:01 PM [place] PH: [phone]   FX: [phone] [place]

## 2018-11-03 ENCOUNTER — Ambulatory Visit: Payer: Non-veteran care | Admitting: Obstetrics & Gynecology

## 2018-11-04 ENCOUNTER — Ambulatory Visit: Payer: Non-veteran care | Admitting: Obstetrics and Gynecology

## 2018-11-16 ENCOUNTER — Ambulatory Visit: Payer: Non-veteran care | Admitting: Obstetrics and Gynecology

## 2018-12-07 ENCOUNTER — Ambulatory Visit (INDEPENDENT_AMBULATORY_CARE_PROVIDER_SITE_OTHER): Payer: No Typology Code available for payment source | Admitting: Obstetrics and Gynecology

## 2018-12-07 ENCOUNTER — Emergency Department (HOSPITAL_COMMUNITY): Payer: No Typology Code available for payment source

## 2018-12-07 ENCOUNTER — Emergency Department (HOSPITAL_COMMUNITY)
Admission: EM | Admit: 2018-12-07 | Discharge: 2018-12-07 | Disposition: A | Discharge status: Home / Self Care | Payer: No Typology Code available for payment source | Attending: Emergency Medicine | Admitting: Emergency Medicine

## 2018-12-07 ENCOUNTER — Encounter (HOSPITAL_COMMUNITY): Payer: Self-pay | Admitting: Emergency Medicine

## 2018-12-07 ENCOUNTER — Encounter: Payer: Self-pay | Admitting: Obstetrics and Gynecology

## 2018-12-07 ENCOUNTER — Other Ambulatory Visit (HOSPITAL_COMMUNITY)
Admission: RE | Admit: 2018-12-07 | Discharge: 2018-12-07 | Disposition: A | Discharge status: Home / Self Care | Payer: No Typology Code available for payment source | Source: Ambulatory Visit | Attending: Obstetrics and Gynecology | Admitting: Obstetrics and Gynecology

## 2018-12-07 VITALS — BP 165/116 | HR 120 | Wt 194.0 lb

## 2018-12-07 DIAGNOSIS — N761 Subacute and chronic vaginitis: Secondary | ICD-10-CM | POA: Insufficient documentation

## 2018-12-07 DIAGNOSIS — J181 Lobar pneumonia, unspecified organism: Secondary | ICD-10-CM | POA: Diagnosis not present

## 2018-12-07 DIAGNOSIS — I10 Essential (primary) hypertension: Secondary | ICD-10-CM | POA: Insufficient documentation

## 2018-12-07 DIAGNOSIS — R05 Cough: Secondary | ICD-10-CM | POA: Diagnosis present

## 2018-12-07 DIAGNOSIS — Z79899 Other long term (current) drug therapy: Secondary | ICD-10-CM | POA: Insufficient documentation

## 2018-12-07 DIAGNOSIS — J189 Pneumonia, unspecified organism: Secondary | ICD-10-CM

## 2018-12-07 DIAGNOSIS — Z96652 Presence of left artificial knee joint: Secondary | ICD-10-CM | POA: Diagnosis not present

## 2018-12-07 MED ORDER — DOXYCYCLINE HYCLATE 100 MG PO CAPS: 100.0000 mg | ORAL_CAPSULE | Freq: Two times a day (BID) | ORAL | 0 refills | Status: AC

## 2018-12-07 MED ORDER — FLUCONAZOLE 150 MG PO TABS: 150.0000 mg | ORAL_TABLET | Freq: Once | ORAL | 0 refills | Status: AC

## 2018-12-07 NOTE — ED Triage Notes (Signed)
Per pt, states she has been sick since 12/28-states nasal congestion, dry cough-body aches-states no relief with OTC meds

## 2018-12-07 NOTE — Progress Notes (Signed)
48 yo s/p hysterectomy here for the evaluation of BV. Patient was diagnosed with BV in August and desire to ensure that infection has resolved. She is sexually active with her husband. She reports some dysmenorrhea and is limited in movement due to a recent knee surgery. Patient reports the presence of an odorless, non pruritic discharge. Patient admits to some vasomotor symptoms. Patient is without other complaints. Patient was discharged from the hospital today with a diagnosis of pneumonia.   Past Medical History:  Diagnosis Date  . Arthritis   . Depression   . High cholesterol   . Hypertension   . Migraine   . OSA (obstructive sleep apnea)    states she has never used CPAP. "borderline"   Past Surgical History:  Procedure Laterality Date  . ABDOMINAL HYSTERECTOMY    . CHONDROPLASTY Left 08/03/2015   Procedure: CHONDROPLASTY;  Surgeon: [name], MD;  Location: [facility];  Service: Orthopedics;  Laterality: Left;  . CLOSED MANIPULATION KNEE WITH STERIOD INJECTION Left 02/06/2017   Procedure: Closed MANIPULATION of knee with Steroid Injection;  Surgeon: [name], MD;  Location: [facility];  Service: Orthopedics;  Laterality: Left;  . HERNIA REPAIR    . JOINT REPLACEMENT Left 06/26/2016   Dr Percell Miller  . KNEE ARTHROSCOPY WITH LATERAL RELEASE Left 08/03/2015   Procedure: LEFT KNEE ARTHROSCOPY  CHONDROPLASTY WITH LATERAL RELEASE;  Surgeon: [name], MD;  Location: [facility];  Service: Orthopedics;  Laterality: Left;  . TOTAL KNEE ARTHROPLASTY Left 06/26/2016   Procedure: TOTAL KNEE ARTHROPLASTY;  Surgeon: [name], MD;  Location: [facility];  Service: Orthopedics;  Laterality: Left;   Family History  Problem Relation Age of Onset  . Cancer Maternal Grandfather        colon  . Cancer Paternal Grandmother        cervical or colon????  . Cancer Cousin        prostate  . Hypertension Mother   . Diabetes Father   . Hypertension  Father   . Heart disease Father        chf  . Hypertension Brother    Social History   Tobacco Use  . Smoking status: Never Smoker  . Smokeless tobacco: Never Used  Substance Use Topics  . Alcohol use: Yes    Comment: wine rearly  . Drug use: No   ROS See pertinent in HPI  Blood pressure (!) 165/116, pulse (!) 120, weight 194 lb (88 kg). GENERAL: Well-developed, well-nourished female in no acute distress.  ABDOMEN: Soft, nontender, nondistended. No organomegaly. PELVIC: Normal external female genitalia. Vagina is pink and rugated.  Normal discharge. No adnexal mass or tenderness. EXTREMITIES: No cyanosis, clubbing, or edema, 2+ distal pulses.  A/P 48 yo here for evaluation of bacterial vaginosis - wet prep collected - Patient will be contacted with results - rx diflucan provided in the event that patient develops a yeast infection following antibiotic course

## 2018-12-07 NOTE — ED Provider Notes (Signed)
[facility] DEPT Provider Note   CSN: [id number] Arrival date & time: 12/07/18  1039     History   Chief Complaint Chief Complaint  Patient presents with  . URI    HPI Rhonda Gibbs is a 48 y.o. female.  48 y.o female with a PMH of arthritis and depression,HTN presents to the ED with a chief complaint of body aches, cough since 11/28/2018.  Reports symptoms began with a scratchy throat which then turned into a productive cough which has now turned into a dry cough along with chills and body aches.  Reports taking DayQuil, NyQuil, TheraFlu, Mucinex, Robitussin and has no improvement in symptoms.  She reports the symptoms worsened.  Denies any alleviating or exacerbating factors.  She denies any previous history of asthma or smoking.  Denies any fever, chills, shortness of breath or other complaints.     Past Medical History:  Diagnosis Date  . Arthritis   . Depression   . High cholesterol   . Hypertension   . Migraine   . OSA (obstructive sleep apnea)    states she has never used CPAP. "borderline"    Patient Active Problem List   Diagnosis Date Noted  . Primary localized osteoarthritis of left knee 06/26/2016  . HTN (hypertension) 01/31/2014    Past Surgical History:  Procedure Laterality Date  . ABDOMINAL HYSTERECTOMY    . CHONDROPLASTY Left 08/03/2015   Procedure: CHONDROPLASTY;  Surgeon: [name], MD;  Location: [facility];  Service: Orthopedics;  Laterality: Left;  . CLOSED MANIPULATION KNEE WITH STERIOD INJECTION Left 02/06/2017   Procedure: Closed MANIPULATION of knee with Steroid Injection;  Surgeon: [name], MD;  Location: [place];  Service: Orthopedics;  Laterality: Left;  . HERNIA REPAIR    . JOINT REPLACEMENT Left 06/26/2016   Dr Percell Miller  . KNEE ARTHROSCOPY WITH LATERAL RELEASE Left 08/03/2015   Procedure: LEFT KNEE ARTHROSCOPY  CHONDROPLASTY WITH LATERAL RELEASE;  Surgeon: [name], MD;  Location: [facility];  Service: Orthopedics;  Laterality: Left;  . TOTAL KNEE ARTHROPLASTY Left 06/26/2016   Procedure: TOTAL KNEE ARTHROPLASTY;  Surgeon: [name], MD;  Location: [facility];  Service: Orthopedics;  Laterality: Left;     OB History    Gravida  3   Para  3   Term      Preterm      AB      Living  3     SAB      TAB      Ectopic      Multiple      Live Births               Home Medications    Prior to Admission medications   Medication Sig Start Date End Date Taking? Authorizing Provider  atenolol (TENORMIN) 50 MG tablet Take 50 mg by mouth daily.    [provider]  doxycycline (VIBRAMYCIN) 100 MG capsule Take 1 capsule (100 mg total) by mouth 2 (two) times daily for 7 days. 12/07/18 12/14/18  Janeece Fitting, PA-C  sertraline (ZOLOFT) 100 MG tablet Take 100 mg by mouth daily.    [provider]  spironolactone-hydrochlorothiazide (ALDACTAZIDE) 25-25 MG tablet Take 1 tablet by mouth daily.    [provider]  tiZANidine (ZANAFLEX) 4 MG capsule Take 4 mg by mouth 3 (three) times daily.    [provider]  topiramate (TOPAMAX) 25 MG tablet  Take 25 mg by mouth 2 (two) times daily as needed. Reported on 06/05/2016    [provider]  traZODone (DESYREL) 50 MG tablet Take 50 mg by mouth at bedtime.    [provider]    Family History Family History  Problem Relation Age of Onset  . Cancer Maternal Grandfather        colon  . Cancer Paternal Grandmother        cervical or colon????  . Cancer Cousin        prostate  . Hypertension Mother   . Diabetes Father   . Hypertension Father   . Heart disease Father        chf  . Hypertension Brother     Social History Social History   Tobacco Use  . Smoking status: Never Smoker  . Smokeless tobacco: Never Used  Substance Use Topics  . Alcohol use: Yes    Comment: wine rearly  . Drug use: No     Allergies   No  known allergies   Review of Systems Review of Systems  Constitutional: Negative for fever.  Respiratory: Positive for cough (dry). Negative for shortness of breath.   Cardiovascular: Negative for chest pain.     Physical Exam Updated Vital Signs BP 137/83   Pulse 91   Temp 98.8 F (37.1 C) (Oral)   Resp 16   SpO2 100%   Physical Exam Vitals signs and nursing note reviewed.  Constitutional:      General: She is not in acute distress.    Appearance: She is well-developed.  HENT:     Head: Normocephalic and atraumatic.     Mouth/Throat:     Pharynx: No oropharyngeal exudate.  Eyes:     Pupils: Pupils are equal, round, and reactive to light.  Neck:     Musculoskeletal: Normal range of motion.  Cardiovascular:     Rate and Rhythm: Regular rhythm.     Heart sounds: Normal heart sounds.  Pulmonary:     Effort: Pulmonary effort is normal. No respiratory distress.     Breath sounds: Wheezing present. No rhonchi or rales.     Comments: Slight wheezing appreciated on the lower lung fields. Chest:     Chest wall: No tenderness.  Abdominal:     General: Bowel sounds are normal. There is no distension.     Palpations: Abdomen is soft.     Tenderness: There is no abdominal tenderness.  Musculoskeletal:        General: No tenderness or deformity.     Right lower leg: No edema.     Left lower leg: No edema.  Skin:    General: Skin is warm and dry.  Neurological:     Mental Status: She is alert and oriented to person, place, and time.      ED Treatments / Results  Labs (all labs ordered are listed, but only abnormal results are displayed) Labs Reviewed - No data to display  EKG None  Radiology Dg Chest 2 View  Result Date: 12/07/2018 CLINICAL DATA:  Cough and congestion for few days EXAM: CHEST - 2 VIEW COMPARISON:  08/11/2012 FINDINGS: Cardiac shadow is stable. The lungs are well aerated bilaterally. Patchy infiltrate is noted in the right lung base projecting in  the right middle lobe on the lateral film. No acute bony abnormality is noted. IMPRESSION: Patchy right basilar infiltrate. Electronically Signed   By: Inez Catalina M.D.   On: 12/07/2018 13:10  Procedures Procedures (including critical care time)  Medications Ordered in ED Medications - No data to display   Initial Impression / Assessment and Plan / ED Course  I have reviewed the triage vital signs and the nursing notes.  Pertinent labs & imaging results that were available during my care of the patient were reviewed by me and considered in my medical decision making (see chart for details).    Presents with URI symptoms for the past 2 weeks, reports her cough is dry at this time has not been relieved with any over-the-counter medication.  During evaluation there is scattered wheezing throughout lower lung fields.  Will obtain chest x-ray to evaluate for pneumonia.  Patient reports that she does not need any testing and that she has an appointment to go to cannot be late.  She reports she was seen at the [place] at this morning and had no acute process.  She is adamant to be tested for the flu.  At this time I have advised patient we will not be testing her for the flu as my management will not change, I would be unable to provide her with Tamiflu.  Patient awaiting chest x-ray.  DG Chest 2 view showed: Patchy right basilar infiltrate. At this time will treat patient with outpatient antibiotics, she is advised to follow up with PCP after completion of therapy. Curb 65  Is zero at this time, slight elevation of HR likely due to cough. Oxygen saturation is 98%, will treat with doxycycline. Return precautions discussed at length.   Final Clinical Impressions(s) / ED Diagnoses   Final diagnoses:  Community acquired pneumonia of right lower lobe of lung [facility])    ED Discharge Orders         Ordered    doxycycline (VIBRAMYCIN) 100 MG capsule  2 times daily     12/07/18 1328             Janeece Fitting, PA-C 12/07/18 [place], [place], DO 12/07/18 1415

## 2018-12-07 NOTE — Progress Notes (Signed)
NGYN c/o recurrent BV infections. Elevated BP - did not take meds today.

## 2018-12-07 NOTE — Discharge Instructions (Addendum)
I have prescribed antibiotics to treat your pneumonia, please take 1 tablet twice a day for the next 7 days. Please follow up with PCP after completion of therapy. If you experience shortness of breath, chest pain or worsening symptoms return to the ED.

## 2018-12-08 LAB — CERVICOVAGINAL ANCILLARY ONLY
BACTERIAL VAGINITIS: POSITIVE — AB
Candida vaginitis: NEGATIVE

## 2018-12-09 MED ORDER — METRONIDAZOLE 500 MG PO TABS: 500.0000 mg | ORAL_TABLET | Freq: Two times a day (BID) | ORAL | 0 refills | Status: DC

## 2018-12-09 NOTE — Addendum Note (Signed)
Addended by: Mora Bellman on: 12/09/2018 03:11 PM   Modules accepted: Orders

## 2019-01-02 ENCOUNTER — Emergency Department (HOSPITAL_COMMUNITY)
Admission: EM | Admit: 2019-01-02 | Discharge: 2019-01-02 | Disposition: A | Discharge status: Home / Self Care | Payer: No Typology Code available for payment source | Attending: Emergency Medicine | Admitting: Emergency Medicine

## 2019-01-02 ENCOUNTER — Encounter (HOSPITAL_COMMUNITY): Payer: Self-pay | Admitting: *Deleted

## 2019-01-02 DIAGNOSIS — Z96652 Presence of left artificial knee joint: Secondary | ICD-10-CM | POA: Diagnosis not present

## 2019-01-02 DIAGNOSIS — M545 Low back pain, unspecified: Secondary | ICD-10-CM

## 2019-01-02 DIAGNOSIS — Z79899 Other long term (current) drug therapy: Secondary | ICD-10-CM | POA: Diagnosis not present

## 2019-01-02 DIAGNOSIS — I10 Essential (primary) hypertension: Secondary | ICD-10-CM | POA: Diagnosis not present

## 2019-01-02 LAB — URINALYSIS, ROUTINE W REFLEX MICROSCOPIC
Bilirubin Urine: NEGATIVE
Glucose, UA: NEGATIVE mg/dL
Hgb urine dipstick: NEGATIVE
KETONES UR: NEGATIVE mg/dL
Leukocytes, UA: NEGATIVE
Nitrite: NEGATIVE
PH: 6 (ref 5.0–8.0)
Protein, ur: NEGATIVE mg/dL
Specific Gravity, Urine: 1.014 (ref 1.005–1.030)

## 2019-01-02 MED ORDER — SODIUM CHLORIDE 0.9 % IV BOLUS: 1000.0000 mL | Freq: Once | INTRAVENOUS | Status: DC

## 2019-01-02 NOTE — ED Provider Notes (Signed)
[facility] DEPT Provider Note   CSN: [id number] Arrival date & time: 01/02/19  1751     History   Chief Complaint Chief Complaint  Patient presents with  . Abdominal Pain    HPI Rhonda Gibbs is a 48 y.o. female with a past medical history of hypertension, who presents today for evaluation of right-sided lower back pain for 5 days.  She reports that she has been drinking protein shakes exclusively for the past 14 days and was constipated.  She used 2 suppositories after which she had a hard bowel movement.  She reports that during this she was straining and felt a pain in the right side of her lower back.  She says that this pain is been present for 5 days.  She denies any abdominal pain.  No dysuria, hematuria increased frequency or urgency.  She reports since then she has had additional bowel movements without difficulty.  She reports that her primary care doctor told her to come and get checked for appendicitis.  HPI  Past Medical History:  Diagnosis Date  . Arthritis   . Depression   . High cholesterol   . Hypertension   . Migraine   . OSA (obstructive sleep apnea)    states she has never used CPAP. "borderline"    Patient Active Problem List   Diagnosis Date Noted  . Primary localized osteoarthritis of left knee 06/26/2016  . HTN (hypertension) 01/31/2014    Past Surgical History:  Procedure Laterality Date  . ABDOMINAL HYSTERECTOMY    . CHONDROPLASTY Left 08/03/2015   Procedure: CHONDROPLASTY;  Surgeon: [name], MD;  Location: [place];  Service: Orthopedics;  Laterality: Left;  . CLOSED MANIPULATION KNEE WITH STERIOD INJECTION Left 02/06/2017   Procedure: Closed MANIPULATION of knee with Steroid Injection;  Surgeon: [name], MD;  Location: [place] [facility];  Service: Orthopedics;  Laterality: Left;  . HERNIA REPAIR    . JOINT REPLACEMENT Left 06/26/2016   Dr Percell Miller  . KNEE ARTHROSCOPY WITH  LATERAL RELEASE Left 08/03/2015   Procedure: LEFT KNEE ARTHROSCOPY  CHONDROPLASTY WITH LATERAL RELEASE;  Surgeon: [name], MD;  Location: [facility];  Service: Orthopedics;  Laterality: Left;  . TOTAL KNEE ARTHROPLASTY Left 06/26/2016   Procedure: TOTAL KNEE ARTHROPLASTY;  Surgeon: [name], MD;  Location: [facility];  Service: Orthopedics;  Laterality: Left;     OB History    Gravida  3   Para  3   Term      Preterm      AB      Living  3     SAB      TAB      Ectopic      Multiple      Live Births  3            Home Medications    Prior to Admission medications   Medication Sig Start Date End Date Taking? Authorizing Provider  atenolol (TENORMIN) 50 MG tablet Take 50 mg by mouth daily.    [provider]  metroNIDAZOLE (FLAGYL) 500 MG tablet Take 1 tablet (500 mg total) by mouth 2 (two) times daily. 12/09/18   Constant, Peggy, MD  sertraline (ZOLOFT) 100 MG tablet Take 100 mg by mouth daily.    [provider]  spironolactone-hydrochlorothiazide (ALDACTAZIDE) 25-25 MG tablet Take 1 tablet by mouth daily.    [provider]  tiZANidine (ZANAFLEX) 4  MG capsule Take 4 mg by mouth 3 (three) times daily.    [provider]  topiramate (TOPAMAX) 25 MG tablet Take 25 mg by mouth 2 (two) times daily as needed. Reported on 06/05/2016    [provider]  traZODone (DESYREL) 50 MG tablet Take 50 mg by mouth at bedtime.    [provider]    Family History Family History  Problem Relation Age of Onset  . Cancer Maternal Grandfather        colon  . Cancer Paternal Grandmother        cervical or colon????  . Cancer Cousin        prostate  . Hypertension Mother   . Diabetes Father   . Hypertension Father   . Heart disease Father        chf  . Hypertension Brother     Social History Social History   Tobacco Use  . Smoking status: Never Smoker  . Smokeless tobacco: Never Used  Substance  Use Topics  . Alcohol use: Yes    Comment: wine rearly  . Drug use: No     Allergies   No known allergies   Review of Systems Review of Systems  Constitutional: Negative for chills and fever.  Respiratory: Negative for chest tightness and shortness of breath.   Cardiovascular: Negative for chest pain.  Gastrointestinal: Positive for constipation. Negative for abdominal pain, blood in stool, diarrhea, nausea and vomiting.  Genitourinary: Negative for dysuria, flank pain, menstrual problem (S/p hysterectomy), vaginal bleeding, vaginal discharge and vaginal pain.  Musculoskeletal: Positive for back pain.  Neurological: Negative for weakness.  All other systems reviewed and are negative.    Physical Exam Updated Vital Signs BP (!) 148/102 (BP Location: Right Arm)   Pulse 98   Temp 98.9 F (37.2 C) (Oral)   Resp 17   SpO2 98%   Physical Exam Vitals signs and nursing note reviewed.  Constitutional:      General: She is not in acute distress.    Appearance: She is well-developed. She is not diaphoretic.  HENT:     Head: Normocephalic and atraumatic.  Eyes:     General: No scleral icterus.       Right eye: No discharge.        Left eye: No discharge.     Conjunctiva/sclera: Conjunctivae normal.  Neck:     Musculoskeletal: Normal range of motion.  Cardiovascular:     Rate and Rhythm: Normal rate and regular rhythm.     Heart sounds: Normal heart sounds.     Comments: 2+ DP pulses bilaterally.  Pulmonary:     Effort: Pulmonary effort is normal. No respiratory distress.     Breath sounds: Normal breath sounds. No stridor.  Abdominal:     General: Abdomen is flat. Bowel sounds are normal. There is no distension. There are no signs of injury.     Palpations: Abdomen is soft.     Tenderness: There is no abdominal tenderness. There is no right CVA tenderness, left CVA tenderness, guarding or rebound. Negative signs include Murphy's sign.     Hernia: No hernia is present.    Musculoskeletal:        General: No deformity.     Comments: Palpation over right sided superior buttock both re-creates and exacerbates her reported back pain.  She does not have any midline T/L-spine tenderness to palpation, step-offs, or deformities.  Skin:    General: Skin is warm and dry.  Neurological:     Mental Status: She is alert.     Motor: No abnormal muscle tone.     Comments: Sensation intact to bilateral lower extremities.  Psychiatric:        Behavior: Behavior normal.      ED Treatments / Results  Labs (all labs ordered are listed, but only abnormal results are displayed) Labs Reviewed  URINALYSIS, ROUTINE W REFLEX MICROSCOPIC    EKG None  Radiology No results found.  Procedures Procedures (including critical care time)  Medications Ordered in ED Medications - No data to display   Initial Impression / Assessment and Plan / ED Course  I have reviewed the triage vital signs and the nursing notes.  Pertinent labs & imaging results that were available during my care of the patient were reviewed by me and considered in my medical decision making (see chart for details).    Patient presents today for evaluation of right-sided low back pain that started after she strained during a bowel movement.  She does not have any abdominal pain.  She has localized tenderness to palpation over her right-sided superior buttock.  Palpation here both re-creates and exacerbates her reported pain.  Distally she is neurovascularly intact with intact sensation and distal pulses.  She does not have any abdominal pain on ROS or with palpation.  She does report continued constipation as she has been only drinking protein shakes.  Recommended MiraLAX as needed to help with this.  Urine was obtained which was unremarkable.  She was offered additional evaluation which she declined, stating that she can "get that at the [place]."  She is status post hysterectomy and therefore not  pregnant.  Return precautions were discussed with patient who states their understanding.  At the time of discharge patient denied any unaddressed complaints or concerns.  Patient is agreeable for discharge home.   Final Clinical Impressions(s) / ED Diagnoses   Final diagnoses:  Acute right-sided low back pain without sciatica    ED Discharge Orders    None       Ollen Gross 01/03/19 0121    Lacretia Leigh, MD 01/03/19 [phone]

## 2019-01-02 NOTE — Discharge Instructions (Addendum)
Today your urine did not show any evidence of infection.  Please follow-up with your primary care doctor.  As we discussed for constipation I would recommend taking MiraLAX.  You may take up to 6 doses a day, however the more of that you take the more likely you are to have crampy abdominal pain.  I would recommend taking 3 doses a day.  It may take 1 to 3 days for you to have a bowel movement.  In the emergency room your blood pressure was elevated.  Please follow-up with your primary care doctor to have this rechecked.  Please take Ibuprofen (Advil, motrin) and Tylenol (acetaminophen) to relieve your pain.  You may take up to 600 MG (3 pills) of normal strength ibuprofen every 8 hours as needed.  In between doses of ibuprofen you make take tylenol, up to 1,000 mg (two extra strength pills).  Do not take more than 3,000 mg tylenol in a 24 hour period.  Please check all medication labels as many medications such as pain and cold medications may contain tylenol.  Do not drink alcohol while taking these medications.  Do not take other NSAID'S while taking ibuprofen (such as aleve or naproxen).  Please take ibuprofen with food to decrease stomach upset.

## 2019-01-02 NOTE — ED Triage Notes (Addendum)
Pt complains of right lower abdominal pain x 5 days. Pt states pain started after having a large bowel movement after using fleet enema. Pt's PCP advised pt come to rule out appendicitis.

## 2019-01-02 NOTE — ED Notes (Signed)
ED Provider at bedside. 

## 2019-01-02 NOTE — ED Notes (Signed)
ED provider plans to discharge patient. ED provider verbally told this writer the plan of care. ED provider will cancel all orders.

## 2019-02-01 ENCOUNTER — Other Ambulatory Visit: Payer: Self-pay | Admitting: Orthopaedic Surgery

## 2019-02-01 DIAGNOSIS — M545 Low back pain, unspecified: Secondary | ICD-10-CM

## 2019-02-06 ENCOUNTER — Ambulatory Visit
Admission: RE | Admit: 2019-02-06 | Discharge: 2019-02-06 | Disposition: A | Discharge status: Home / Self Care | Payer: Non-veteran care | Source: Ambulatory Visit | Attending: Orthopaedic Surgery | Admitting: Orthopaedic Surgery

## 2019-02-06 DIAGNOSIS — M545 Low back pain, unspecified: Secondary | ICD-10-CM

## 2019-04-14 ENCOUNTER — Other Ambulatory Visit: Payer: Self-pay | Admitting: Internal Medicine

## 2019-04-14 DIAGNOSIS — Z1231 Encounter for screening mammogram for malignant neoplasm of breast: Secondary | ICD-10-CM

## 2019-09-15 ENCOUNTER — Ambulatory Visit: Payer: Non-veteran care | Admitting: Physical Therapy

## 2019-09-20 ENCOUNTER — Other Ambulatory Visit: Payer: Self-pay

## 2019-09-20 ENCOUNTER — Other Ambulatory Visit: Payer: Self-pay | Admitting: Internal Medicine

## 2019-09-20 ENCOUNTER — Ambulatory Visit
Admission: RE | Admit: 2019-09-20 | Discharge: 2019-09-20 | Disposition: A | Discharge status: Home / Self Care | Payer: Non-veteran care | Source: Ambulatory Visit | Attending: Internal Medicine | Admitting: Internal Medicine

## 2019-09-20 DIAGNOSIS — N63 Unspecified lump in unspecified breast: Secondary | ICD-10-CM

## 2019-09-20 DIAGNOSIS — Z1231 Encounter for screening mammogram for malignant neoplasm of breast: Secondary | ICD-10-CM

## 2019-09-21 ENCOUNTER — Other Ambulatory Visit: Payer: Self-pay

## 2019-09-23 ENCOUNTER — Encounter: Payer: Self-pay | Admitting: Endocrinology

## 2019-09-23 ENCOUNTER — Ambulatory Visit (INDEPENDENT_AMBULATORY_CARE_PROVIDER_SITE_OTHER): Payer: No Typology Code available for payment source | Admitting: Endocrinology

## 2019-09-23 ENCOUNTER — Ambulatory Visit: Payer: Non-veteran care | Admitting: Endocrinology

## 2019-09-23 ENCOUNTER — Other Ambulatory Visit: Payer: Self-pay

## 2019-09-23 VITALS — BP 160/100 | HR 91 | Ht 65.0 in | Wt 178.8 lb

## 2019-09-23 DIAGNOSIS — E041 Nontoxic single thyroid nodule: Secondary | ICD-10-CM | POA: Diagnosis not present

## 2019-09-23 NOTE — Progress Notes (Signed)
Patient ID: Rhonda Gibbs, female   DOB: 1971/02/10, 48 y.o.   MRN: [id number]              Reason for Appointment: Evaluation of thyroid nodule    History of Present Illness:   The patient is being referred by her primary care physician at the [facility]  She was complaining about her left side of the neck swelling and having some discomfort and was sent for ultrasound of the neck She has not had any difficulty swallowing or any choking feeling in the neck  She has not had any previous thyroid issues and does not know if she was found to have thyroid enlargement on exam  The thyroid ultrasound showed a 1.5 cm isoechoic nodule on the left side Radiologist has recommended 1 year follow-up for this She also has other subcentimeter nodules present without any high-grade features  TSH was 2.8 done on 12/29/2018  No results found for: FREET4, TSH  Allergies as of 09/23/2019      Reactions   No Known Allergies       Medication List       Accurate as of September 23, 2019 12:03 PM. If you have any questions, ask your nurse or doctor.        alprazolam 2 MG tablet Commonly known as: XANAX Take 2 mg by mouth 3 times/day as needed-between meals & bedtime for sleep. 1 tab PO 1 hour before EMG.   amLODipine 5 MG tablet Commonly known as: NORVASC Take 5 mg by mouth daily.   atenolol 50 MG tablet Commonly known as: TENORMIN Take 50 mg by mouth daily.   Buprenorphine 15 MCG/HR Ptwk Place 1 patch onto the skin once a week. Apply 1 patch to body once every 7 days.   carboxymethylcellulose 0.5 % Soln Commonly known as: REFRESH PLUS 1 drop 3 (three) times daily as needed.   estradiol 0.1 MG/24HR patch Commonly known as: VIVELLE-DOT Place 1 patch onto the skin 2 (two) times daily. Apply 1 patch to body twice daily.   fluticasone 50 MCG/ACT nasal spray Commonly known as: FLONASE Place 2 sprays into both nostrils 2 (two) times daily.   gabapentin 400 MG capsule  Commonly known as: NEURONTIN Take 400 mg by mouth 3 (three) times daily as needed. Take 1 tablet by mouth three time daily as needed.   indomethacin 50 MG capsule Commonly known as: INDOCIN Take 50 mg by mouth 2 (two) times daily with a meal. Take 1 tablet by mouth twice daily.   lidocaine 5 % Commonly known as: LIDODERM Place 1 patch onto the skin daily. Remove & Discard patch within 12 hours or as directed by MD   lubiprostone 24 MCG capsule Commonly known as: AMITIZA Take 24 mcg by mouth daily. Take 1 capsule by mouth once daily.   MENTHOL-METHYL SALICYLATE EX Apply topically. Apply small amount to affected area four times daily.   metroNIDAZOLE 500 MG tablet Commonly known as: Flagyl Take 1 tablet (500 mg total) by mouth 2 (two) times daily.   omeprazole 20 MG capsule Commonly known as: PRILOSEC Take 20 mg by mouth daily. Take 1 capsule by mouth once daily in the am..   polyethylene glycol 17 g packet Commonly known as: MIRALAX / GLYCOLAX Take 17 g by mouth daily.   psyllium 58.6 % packet Commonly known as: METAMUCIL Take 1 packet by mouth daily.   sertraline 100 MG tablet Commonly known as: ZOLOFT Take 100 mg by mouth  daily.   spironolactone-hydrochlorothiazide 25-25 MG tablet Commonly known as: ALDACTAZIDE Take 1 tablet by mouth daily.   SUMAtriptan 100 MG tablet Commonly known as: IMITREX Take 100 mg by mouth every 2 (two) hours as needed for migraine. First dose at onset of headache, then repeat in 2 hours PRN.   tiZANidine 4 MG capsule Commonly known as: ZANAFLEX Take 4 mg by mouth 3 (three) times daily.   topiramate 25 MG tablet Commonly known as: TOPAMAX Take 25 mg by mouth daily. Take 3 tablets by mouth every morning.   traZODone 50 MG tablet Commonly known as: DESYREL Take 50 mg by mouth at bedtime.       Allergies:  Allergies  Allergen Reactions  . No Known Allergies     Past Medical History:  Diagnosis Date  . Arthritis   .  Depression   . High cholesterol   . Hypertension   . Migraine   . OSA (obstructive sleep apnea)    states she has never used CPAP. "borderline"    There is no history of radiation to the neck in childhood  Past Surgical History:  Procedure Laterality Date  . ABDOMINAL HYSTERECTOMY    . CHONDROPLASTY Left 08/03/2015   Procedure: CHONDROPLASTY;  Surgeon: [name], MD;  Location: [place];  Service: Orthopedics;  Laterality: Left;  . CLOSED MANIPULATION KNEE WITH STERIOD INJECTION Left 02/06/2017   Procedure: Closed MANIPULATION of knee with Steroid Injection;  Surgeon: [name], MD;  Location: [place];  Service: Orthopedics;  Laterality: Left;  . HERNIA REPAIR    . JOINT REPLACEMENT Left 06/26/2016   Dr Percell Miller  . KNEE ARTHROSCOPY WITH LATERAL RELEASE Left 08/03/2015   Procedure: LEFT KNEE ARTHROSCOPY  CHONDROPLASTY WITH LATERAL RELEASE;  Surgeon: [name], MD;  Location: [place];  Service: Orthopedics;  Laterality: Left;  . TOTAL KNEE ARTHROPLASTY Left 06/26/2016   Procedure: TOTAL KNEE ARTHROPLASTY;  Surgeon: [name], MD;  Location: [place];  Service: Orthopedics;  Laterality: Left;    Family History  Problem Relation Age of Onset  . Cancer Maternal Grandfather        colon  . Cancer Paternal Grandmother        cervical or colon????  . Cancer Cousin        prostate  . Hypertension Mother   . Thyroid disease Mother        Had surgery, unknown diagnosis  . Diabetes Father   . Hypertension Father   . Heart disease Father        chf  . Hypertension Brother   . Thyroid disease Sister   . Thyroid cancer Neg Hx     Social History:  reports that she has never smoked. She has never used smokeless tobacco. She reports current alcohol use. She reports that she does not use drugs.   Review of Systems  Constitutional: Negative for weight gain.  HENT: Negative for trouble swallowing.   Respiratory: Negative  for shortness of breath.   Cardiovascular: Positive for leg swelling.       Has some swelling of the left leg since her knee surgeries  Gastrointestinal: Negative for abdominal pain.  Endocrine: Positive for fatigue. Negative for heat intolerance.  Musculoskeletal: Positive for joint pain.  Psychiatric/Behavioral: Positive for insomnia.      Examination:   BP (!) 160/100 (BP Location: Left Arm, Patient Position: Sitting, Cuff Size: Normal)   Pulse 91   Ht  5\' 5"  (1.651 m)   Wt 178 lb 12.8 oz (81.1 kg)   SpO2 99%   BMI 29.75 kg/m    General Appearance:  Mild generalized obesity present      Eyes: No prominence or swelling of the eyes          THYROID: There is no enlargement present No nodule palpable on either side   There is no lymphadenopathy in the neck No stridor  Heart sounds normal Lungs clear Abdominal exam deferred, not indicated   Reflexes at biceps are normal.  Skin: No rash or lesions Extremities: Appears to have generalized swelling of the left leg  Assessment/Plan:  Thyroid nodule: She has a 1.5 cm isoechoic nodule which does not need needle aspiration biopsy Discussed with patient that her symptoms of left-sided neck swelling and discomfort are not related to the thyroid She likely has neck discomfort related to her cervical spondylosis  Explained to the patient that small benign thyroid nodules are very common especially with the patient's likely family history of benign thyroid enlargement  Thyroid levels have been normal checked earlier this year  She will need another ultrasound in 1 year to follow-up on the left thyroid nodule and this can be done by the [facility] physician  Follow-up as needed  Consultation note sent to the referring physician  Elayne Snare 09/23/2019

## 2019-09-24 ENCOUNTER — Telehealth: Payer: Self-pay | Admitting: Endocrinology

## 2019-09-24 NOTE — Telephone Encounter (Signed)
Referring physician the [facility] has requesting visit notes from 09/23/2019 visit with Dr Dwyane Dee.  Please send to the [place] fax to [phone]

## 2019-09-24 NOTE — Telephone Encounter (Signed)
Visit notes have been faxed.

## 2019-10-01 ENCOUNTER — Ambulatory Visit
Admission: RE | Admit: 2019-10-01 | Discharge: 2019-10-01 | Disposition: A | Discharge status: Home / Self Care | Payer: Non-veteran care | Source: Ambulatory Visit | Attending: Internal Medicine | Admitting: Internal Medicine

## 2019-10-01 ENCOUNTER — Other Ambulatory Visit: Payer: Self-pay

## 2019-10-01 DIAGNOSIS — N63 Unspecified lump in unspecified breast: Secondary | ICD-10-CM

## 2019-10-05 ENCOUNTER — Ambulatory Visit: Payer: Non-veteran care | Admitting: Physical Therapy

## 2019-10-06 ENCOUNTER — Ambulatory Visit: Payer: No Typology Code available for payment source | Attending: Internal Medicine | Admitting: Physical Therapy

## 2019-10-06 ENCOUNTER — Encounter: Payer: Self-pay | Admitting: Physical Therapy

## 2019-10-06 ENCOUNTER — Other Ambulatory Visit: Payer: Self-pay

## 2019-10-06 DIAGNOSIS — M6281 Muscle weakness (generalized): Secondary | ICD-10-CM | POA: Diagnosis present

## 2019-10-06 DIAGNOSIS — M25551 Pain in right hip: Secondary | ICD-10-CM | POA: Insufficient documentation

## 2019-10-06 DIAGNOSIS — R2689 Other abnormalities of gait and mobility: Secondary | ICD-10-CM | POA: Diagnosis present

## 2019-10-06 NOTE — Therapy (Signed)
[facility] [address] [place], [place], [address] Phone: [phone]   Fax:  [phone]  Physical Therapy Evaluation  Patient Details  Name: Rhonda Gibbs MRN: [id number] Date of Birth: May 20, 1971 Referring Provider (PT): Fortunato Curling, MD   Encounter Date: 10/06/2019  PT End of Session - 10/06/19 1332    Visit Number  1    Number of Visits  15    Date for PT Re-Evaluation  12/22/19    Authorization Type  [place]    Authorization Time Period  15 visits from 08/11/19 to 12/22/19    Authorization - Visit Number  1    Authorization - Number of Visits  15    PT Start Time  1100    PT Stop Time  1140    PT Time Calculation (min)  40 min    Activity Tolerance  Patient limited by pain    Behavior During Therapy  Restless       Past Medical History:  Diagnosis Date  . Arthritis   . Depression   . High cholesterol   . Hypertension   . Migraine   . OSA (obstructive sleep apnea)    states she has never used CPAP. "borderline"    Past Surgical History:  Procedure Laterality Date  . ABDOMINAL HYSTERECTOMY    . CHONDROPLASTY Left 08/03/2015   Procedure: CHONDROPLASTY;  Surgeon: [name], MD;  Location: [place];  Service: Orthopedics;  Laterality: Left;  . CLOSED MANIPULATION KNEE WITH STERIOD INJECTION Left 02/06/2017   Procedure: Closed MANIPULATION of knee with Steroid Injection;  Surgeon: [name], MD;  Location: [place];  Service: Orthopedics;  Laterality: Left;  . HERNIA REPAIR    . JOINT REPLACEMENT Left 06/26/2016   Dr Percell Miller  . KNEE ARTHROSCOPY WITH LATERAL RELEASE Left 08/03/2015   Procedure: LEFT KNEE ARTHROSCOPY  CHONDROPLASTY WITH LATERAL RELEASE;  Surgeon: [name], MD;  Location: [place];  Service: Orthopedics;  Laterality: Left;  . TOTAL KNEE ARTHROPLASTY Left 06/26/2016   Procedure: TOTAL KNEE ARTHROPLASTY;  Surgeon: [name], MD;  Location: [place];  Service: Orthopedics;  Laterality: Left;    There were no vitals filed for this visit.   Subjective Assessment - 10/06/19 1109    Subjective  Pt states that she has alot going on. She almost didn't come today because of everything. She has had Rt hip pain for several years following a Lt TKA and revision in 2017 and 2018. She is limited with practically all activity, and has to take her muscle relaxers in order to get comfortable. She has had PT in the past, most recently in 2018 but states this did not help. She is here because her physician sent her.    Pertinent History  Lt TKA 2017, revision and multiple MUA in 2018, Lt foot drop; has functional TENS unit for ambulation    Limitations  Standing;Walking;Sitting;House hold activities    How long can you sit comfortably?  less than 5 minutes    How long can you stand comfortably?  less 5 minutes    Currently in Pain?  Yes   combined: low back, Rt hip, Lt knee, Rt shoulder        [facility] PT Assessment - 10/06/19 0001      Assessment   Medical Diagnosis  pain in Rt hip     Referring Provider (PT)  Fortunato Curling, MD  Onset Date/Surgical Date  --   2017 and 2018   Next MD Visit  unsure     Prior Therapy  2018 didn't feel this helped her knee       Precautions   Precautions  None      Restrictions   Weight Bearing Restrictions  No      Balance Screen   Has the patient fallen in the past 6 months  Yes    How many times?  1-2x a month     Has the patient had a decrease in activity level because of a fear of falling?   Yes    Is the patient reluctant to leave their home because of a fear of falling?   Yes      [place] residence      Prior Function   Leisure  stays at home, no extracurricular activities or hobbies      Cognition   Overall Cognitive Status  Within Functional Limits for tasks assessed      Observation/Other Assessments   Observations  Pt  ambulates with SPC; Pt notably uncomfortable during evaluation, constantly       Posture/Postural Control   Posture Comments  pt stands with weight shited more onto Rt side, Lt knee held in flexion      ROM / Strength   AROM / PROM / Strength  AROM;Strength      AROM   Overall AROM Comments  Rt hip IR 20 deg, ER 20 deg; Lt knee -35 deg extension, 45 deg flexion (measured in standing)      Strength   Overall Strength Comments  unable to accurately assess due to pt unwillingness to lay supine or sidelying; Rt hip testing limited due to inability to maintain stance on Lt LE; Lt hip abduction/extension able to resist against PT in standing      Palpation   Palpation comment  tenderness of Rt lateral hip, gluteals and TFL      Transfers   Comments  Pt unable to sit in standard chair, reuired table to be elevated so that she could lean onto the table for support       Ambulation/Gait   Gait Comments  Lt knee flexion maintained throughout gait cycle, using SPC, decreased step length bilaterally                 Objective measurements completed on examination: See above findings.      [facility] PT Treatment/Exercise - 10/06/19 0001      Exercises   Exercises  Knee/Hip      Knee/Hip Exercises: Seated   Other Seated Knee/Hip Exercises  seated with Rt LE off table and 5# ankle weight to provide gentle hip distraction             PT Education - 10/06/19 1330    Education Details  implications for aquatic therapy; Rt hip self distraction at home with ankle weight; impact other limitations can have on the surrounding body structures    Person(s) Educated  Patient    Methods  Explanation;Demonstration    Comprehension  Verbalized understanding;Returned demonstration       PT Short Term Goals - 10/06/19 1344      PT SHORT TERM GOAL #1   Title  Pt will demo consistency and independence with her initial HEP to promote ROM, strength and mobility improvements.    Time   4    Period  Weeks    Status  New    Target Date  11/04/19      PT SHORT TERM GOAL #2   Title  Pt will be able to participate in a full PT session and mat table exercises without significant increase in her pain.    Time  4    Period  Weeks    Status  New        PT Long Term Goals - 10/06/19 1346      PT LONG TERM GOAL #1   Title  Pt will have atleast 4/5 MMT of the Rt hip which will improve her stability with ambulation.    Time  8    Period  Weeks    Status  New    Target Date  12/06/19      PT LONG TERM GOAL #2   Title  Pt will demo be able to sit and stand from a standard chair with BUE support x5 reps which will increase her independence out in the community.    Time  8    Period  Weeks    Status  New      PT LONG TERM GOAL #3   Title  Pt will report atleast 25% improvement in her functional mobility and hip pain throughout the day.    Time  8    Period  Weeks    Status  New      PT LONG TERM GOAL #4   Title  Pt will have atleast 10 deg improvement in her knee arc ROM which will improve her gait mechanics.    Time  8    Period  Weeks    Status  New             Plan - 10/06/19 1333    Clinical Impression Statement  Pt is a 48 y.o F with history of chronic Rt hip pain onset several years ago following Lt TKA, several MUAs and foot drop following revision in 2018. Pt also has chronic low back pain and Rt shoulder pain. Pt is notably uncomfortable upon arrival and is unable to sit or stand for more than 5 minutes at a time throughout the evaluation. It was difficult to get a thorough evaluation of the pt's ROM and strength limitations due to her refusal to lay supine or sidelying without her muscle relaxers. She does demonstrate mobility impairments and significant compensations in her mechanics with ambulation and sit to stand. In addition, pt is unable to sit in a standard chair due to pain. She is able to complete movement of the Rt hip against gravity, but there  is notable weakness and ROM deficits with attempts at manual resistance of the lower extremity. She has palpable tenderness of the Rt hip gluteals as well. Pt would benefit from skilled PT to address her limitations noted above, combined with aquatic exercise in order to promote pain free movement and facilitate increase in her activity participation at home and in the community.    Personal Factors and Comorbidities  Sex;Social Background;Behavior Pattern;Comorbidity 2;Past/Current Experience;Time since onset of injury/illness/exacerbation;Fitness    Comorbidities  Lt foot drop, depresssion    Examination-Activity Limitations  --   pt reports all activities are limited   Examination-Participation Restrictions  Other   all of the above   Stability/Clinical Decision Making  Unstable/Unpredictable    Clinical Decision Making  High    Rehab Potential  Fair    PT Frequency  2x /  week    PT Duration  8 weeks    PT Treatment/Interventions  ADLs/Self Care Home Management;Aquatic Therapy;Electrical Stimulation;Moist Heat;Stair training;Gait training;Therapeutic activities;Balance training;Therapeutic exercise;Neuromuscular re-education;Patient/family education;Orthotic Fit/Training;Manual techniques;Taping;Dry needling;Passive range of motion    PT Next Visit Plan  f/u on Rt hip distraction with ankle weight; Pt willing to combine seated and standing exercise throughout session but is not likely to participate in exercise on mat table; aquatic info; Rt hip AROM/AAROM; Lt knee HEP for ROM    PT Home Exercise Plan  standing with RW Rt hip distraction wearing ankle weight    Recommended Other Services  Pt with Lt foot drop and knee instability       Patient will benefit from skilled therapeutic intervention in order to improve the following deficits and impairments:  Decreased activity tolerance, Abnormal gait, Decreased balance, Decreased endurance, Decreased mobility, Difficulty walking, Hypomobility,  Increased muscle spasms, Decreased strength, Decreased range of motion, Postural dysfunction, Pain, Impaired flexibility  Visit Diagnosis: Pain in right hip  Other abnormalities of gait and mobility  Muscle weakness (generalized)     Problem List Patient Active Problem List   Diagnosis Date Noted  . Primary localized osteoarthritis of left knee 06/26/2016  . HTN (hypertension) 01/31/2014    1:56 PM,10/06/19 Sherol Dade PT, DPT [place] [facility] [address], [place], [place], [address] Phone: [phone]   Fax:  [phone]  Name: EZEKIEL COUEY MRN: [id number] Date of Birth: 12/15/1970

## 2019-10-11 ENCOUNTER — Ambulatory Visit (INDEPENDENT_AMBULATORY_CARE_PROVIDER_SITE_OTHER): Payer: No Typology Code available for payment source | Admitting: Podiatry

## 2019-10-11 ENCOUNTER — Encounter: Payer: Self-pay | Admitting: Podiatry

## 2019-10-11 ENCOUNTER — Other Ambulatory Visit: Payer: Self-pay | Admitting: Podiatry

## 2019-10-11 ENCOUNTER — Ambulatory Visit (INDEPENDENT_AMBULATORY_CARE_PROVIDER_SITE_OTHER): Payer: No Typology Code available for payment source

## 2019-10-11 ENCOUNTER — Other Ambulatory Visit: Payer: Self-pay

## 2019-10-11 VITALS — BP 165/111 | HR 86 | Resp 16

## 2019-10-11 DIAGNOSIS — M21372 Foot drop, left foot: Secondary | ICD-10-CM

## 2019-10-11 DIAGNOSIS — M722 Plantar fascial fibromatosis: Secondary | ICD-10-CM

## 2019-10-11 DIAGNOSIS — M79671 Pain in right foot: Secondary | ICD-10-CM

## 2019-10-11 DIAGNOSIS — M79672 Pain in left foot: Secondary | ICD-10-CM

## 2019-10-11 NOTE — Progress Notes (Signed)
   Subjective:    Patient ID: Rhonda Gibbs, female    DOB: 02-16-1971, 48 y.o.   MRN: [id number]  HPI    Review of Systems  All other systems reviewed and are negative.      Objective:   Physical Exam        Assessment & Plan:

## 2019-10-14 NOTE — Progress Notes (Signed)
Subjective:   Patient ID: Rhonda Gibbs, female   DOB: 48 y.o.   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  Patient does not smoke and is not currently active   Review of Systems  All other systems reviewed and are negative.       Objective:  Physical Exam Vitals signs and nursing note reviewed.  Constitutional:      Appearance: She is well-developed.  Pulmonary:     Effort: Pulmonary effort is normal.  Musculoskeletal: Normal range of motion.  Skin:    General: Skin is warm.  Neurological:     Mental Status: She is alert.     Neurovascular status found to be intact with muscle strength found to be adequate.  Patient does have loss of the extensor tendon function groove the left and does have anterior tibial function left and does have an abnormal gait process with a slapping of the left foot.  Patient has discomfort plantar fascia left secondary to the gait structure and does have good digital perfusion     Assessment:  Probability that we are dealing here with extensor loss secondary to previous surgery with fascial discomfort and the need for bracing therapy.     Plan:  H&P x-ray reviewed condition discussed with patient and ped orthotist.  We do agree that a different type of brace will be necessary to try to give her better support and prevent the foot slapping and she will work with the [employer] on this.  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

## 2019-10-20 ENCOUNTER — Ambulatory Visit: Payer: No Typology Code available for payment source | Admitting: Physical Therapy

## 2019-10-27 ENCOUNTER — Telehealth: Payer: Self-pay | Admitting: Gastroenterology

## 2019-10-27 NOTE — Telephone Encounter (Signed)
Hi Dr. Bryan Lemma, we have received a referral from the [facility] for a repeat colon. Pt had a colonoscopy in 2014 that was normal, recommendation were to repeat colon in 10 years. I spoke with pt and she stated that her grandmother, grandfather and cousin passed away of colon cancer so referring provider wants her to have another one. We have received her GI records. They will be sent for your review. Please advise on scheduling. Thank you.

## 2019-11-04 NOTE — Telephone Encounter (Signed)
Records reviewed.  Was last seen by her PCM in 07/2019.  Prior to that was evaluated in 01/2019 for constipation, treated with MiraLAX and lubiprostone 24 mcg bid with good clinical improvement.  Has since decreased to lubiprostone 1 pill qod and MiraLAX, with good control constipation.  Normal CBC, AST/ALT, and BMP in 01/2019.  Family history notable for grandmother, grandfather, cousin deceased from colon cancer, and requesting repeat screening at this time.  Last colonoscopy was 05/2013 (indication: Family history of colon cancer) andn/f hemorrhoids and otherwise normal.  Due to family history of multiple second-degree relatives with colon cancer (age unknown), recent change in bowel habits (being treated for constipation), heightened patient/referring provider concerns, agree with plan for repeat colonoscopy for ongoing screening at this time.

## 2019-11-11 NOTE — Telephone Encounter (Signed)
Left message for patient to call back and schedule colonoscopy.

## 2019-11-15 ENCOUNTER — Encounter: Payer: Self-pay | Admitting: Gastroenterology

## 2019-11-16 NOTE — Telephone Encounter (Signed)
Colon scheduled on 12/31/19 at [facility].

## 2019-12-31 ENCOUNTER — Encounter: Payer: No Typology Code available for payment source | Admitting: Gastroenterology

## 2020-01-17 ENCOUNTER — Encounter: Payer: Self-pay | Admitting: Internal Medicine

## 2020-01-18 ENCOUNTER — Encounter: Payer: Self-pay | Admitting: Internal Medicine

## 2020-02-29 ENCOUNTER — Encounter: Payer: Self-pay | Admitting: Gastroenterology

## 2020-03-21 ENCOUNTER — Ambulatory Visit (AMBULATORY_SURGERY_CENTER): Payer: Self-pay | Admitting: *Deleted

## 2020-03-21 ENCOUNTER — Other Ambulatory Visit: Payer: Self-pay

## 2020-03-21 VITALS — Temp 97.4°F | Ht 65.0 in | Wt 164.8 lb

## 2020-03-21 DIAGNOSIS — Z800 Family history of malignant neoplasm of digestive organs: Secondary | ICD-10-CM

## 2020-03-21 DIAGNOSIS — Z01818 Encounter for other preprocedural examination: Secondary | ICD-10-CM

## 2020-03-21 MED ORDER — POLYETHYLENE GLYCOL 3350 17 GM/SCOOP PO POWD: 1.0000 | Freq: Every day | ORAL | 0 refills | Status: DC

## 2020-03-21 MED ORDER — BISACODYL EC 5 MG PO TBEC: 5.0000 mg | DELAYED_RELEASE_TABLET | Freq: Once | ORAL | 0 refills | Status: AC

## 2020-03-21 MED ORDER — PEG-KCL-NACL-NASULF-NA ASC-C 100 G PO SOLR: 1.0000 | Freq: Once | ORAL | 0 refills | Status: AC

## 2020-03-21 NOTE — Progress Notes (Signed)
Pt given 2 day movi prep d/t constipation. No egg or soy allergy known to patient  No issues with past sedation with any surgeries  or procedures, no intubation problems  No diet pills per patient No home 02 use per patient  No blood thinners per patient  Pt denies issues with constipation  No A fib or A flutter  EMMI video sent to pt's e mail   Due to the COVID-19 pandemic we are asking patients to follow these guidelines. Please only bring one care partner. Please be aware that your care partner may wait in the car in the parking lot or if they feel like they will be too hot to wait in the car, they may wait in the lobby on the 4th floor. All care partners are required to wear a mask the entire time (we do not have any that we can provide them), they need to practice social distancing, and we will do a Covid check for all patient's and care partners when you arrive. Also we will check their temperature and your temperature. If the care partner waits in their car they need to stay in the parking lot the entire time and we will call them on their cell phone when the patient is ready for discharge so they can bring the car to the front of the building. Also all patient's will need to wear a mask into building.

## 2020-03-30 ENCOUNTER — Ambulatory Visit (INDEPENDENT_AMBULATORY_CARE_PROVIDER_SITE_OTHER): Payer: Non-veteran care

## 2020-03-30 ENCOUNTER — Other Ambulatory Visit: Payer: Self-pay | Admitting: Gastroenterology

## 2020-03-30 DIAGNOSIS — Z1159 Encounter for screening for other viral diseases: Secondary | ICD-10-CM

## 2020-03-31 LAB — SARS CORONAVIRUS 2 (TAT 6-24 HRS): SARS Coronavirus 2: NEGATIVE

## 2020-04-04 ENCOUNTER — Other Ambulatory Visit: Payer: Self-pay

## 2020-04-04 ENCOUNTER — Encounter: Payer: Self-pay | Admitting: Gastroenterology

## 2020-04-04 ENCOUNTER — Ambulatory Visit (AMBULATORY_SURGERY_CENTER): Payer: No Typology Code available for payment source | Admitting: Gastroenterology

## 2020-04-04 VITALS — BP 120/73 | HR 59 | Temp 97.7°F | Resp 15 | Ht 65.0 in | Wt 164.8 lb

## 2020-04-04 DIAGNOSIS — D124 Benign neoplasm of descending colon: Secondary | ICD-10-CM

## 2020-04-04 DIAGNOSIS — D125 Benign neoplasm of sigmoid colon: Secondary | ICD-10-CM

## 2020-04-04 DIAGNOSIS — Z1211 Encounter for screening for malignant neoplasm of colon: Secondary | ICD-10-CM

## 2020-04-04 DIAGNOSIS — K5730 Diverticulosis of large intestine without perforation or abscess without bleeding: Secondary | ICD-10-CM

## 2020-04-04 DIAGNOSIS — D128 Benign neoplasm of rectum: Secondary | ICD-10-CM

## 2020-04-04 DIAGNOSIS — Z800 Family history of malignant neoplasm of digestive organs: Secondary | ICD-10-CM | POA: Diagnosis not present

## 2020-04-04 DIAGNOSIS — K640 First degree hemorrhoids: Secondary | ICD-10-CM

## 2020-04-04 DIAGNOSIS — K621 Rectal polyp: Secondary | ICD-10-CM

## 2020-04-04 DIAGNOSIS — D122 Benign neoplasm of ascending colon: Secondary | ICD-10-CM | POA: Diagnosis not present

## 2020-04-04 HISTORY — PX: COLONOSCOPY: SHX174

## 2020-04-04 MED ORDER — SODIUM CHLORIDE 0.9 % IV SOLN: 500.0000 mL | Freq: Once | INTRAVENOUS | Status: DC

## 2020-04-04 NOTE — Op Note (Signed)
[place] Patient Name: Bertha Shackett Procedure Date: 04/04/2020 11:45 AM MRN: [id number] Endoscopist: Gerrit Heck , MD Age: 49 Referring MD:  Date of Birth: 1971-01-02 Gender: Female Account #: [id number] Procedure:                Colonoscopy Indications:              Colon cancer screening in patient at increased                            risk: Family history of colorectal cancer in                            multiple 2nd degree relatives                           Family history notable for grandmother,                            grandfather, cousin with CRC                           Last colonoscopy was at [facility] in 05/2013 and notable                            for internal hemorrhoids Medicines:                Monitored Anesthesia Care Procedure:                Pre-Anesthesia Assessment:                           - Prior to the procedure, a History and Physical                            was performed, and patient medications and                            allergies were reviewed. The patient's tolerance of                            previous anesthesia was also reviewed. The risks                            and benefits of the procedure and the sedation                            options and risks were discussed with the patient.                            All questions were answered, and informed consent                            was obtained. Prior Anticoagulants: The patient has  taken no previous anticoagulant or antiplatelet                            agents. ASA Grade Assessment: II - A patient with                            mild systemic disease. After reviewing the risks                            and benefits, the patient was deemed in                            satisfactory condition to undergo the procedure.                           After obtaining informed consent, the colonoscope                            was passed  under direct vision. Throughout the                            procedure, the patient's blood pressure, pulse, and                            oxygen saturations were monitored continuously. The                            Colonoscope was introduced through the anus and                            advanced to the the cecum, identified by                            appendiceal orifice and ileocecal valve. The                            colonoscopy was performed without difficulty. The                            patient tolerated the procedure well. The quality                            of the bowel preparation was excellent. The                            ileocecal valve, appendiceal orifice, and rectum                            were photographed. Scope In: 11:59:43 AM Scope Out: 12:23:16 PM Scope Withdrawal Time: 0 hours 19 minutes 57 seconds  Total Procedure Duration: 0 hours 23 minutes 33 seconds  Findings:                 The perianal and digital rectal examinations were  normal.                           Five sessile polyps were found in the sigmoid colon                            (4) and ascending colon. The polyps were 2 to 5 mm                            in size. These polyps were removed with a cold                            snare. Resection and retrieval were complete.                            Estimated blood loss was minimal.                           Multiple benign appearing sessile polyps were found                            in the rectum. The polyps were 1 to 3 mm in size.                            Several of these polyps were removed with a cold                            snare for histologic representative evaluation.                            Resection and retrieval were complete. Estimated                            blood loss was minimal.                           A few small-mouthed diverticula were found in the                             sigmoid colon.                           Non-bleeding internal hemorrhoids were found during                            retroflexion. The hemorrhoids were small. Complications:            No immediate complications. Estimated Blood Loss:     Estimated blood loss was minimal. Impression:               - Five 2 to 5 mm polyps in the sigmoid colon and in                            the ascending colon, removed with a cold snare.  Resected and retrieved.                           - Multiple 1 to 3 mm polyps in the rectum, removed                            with a cold snare. Resected and retrieved.                           - Diverticulosis in the sigmoid colon.                           - Non-bleeding internal hemorrhoids. Recommendation:           - Patient has a contact number available for                            emergencies. The signs and symptoms of potential                            delayed complications were discussed with the                            patient. Return to normal activities tomorrow.                            Written discharge instructions were provided to the                            patient.                           - Resume previous diet.                           - Continue present medications.                           - Await pathology results.                           - Repeat colonoscopy in 3 - 5 years for                            surveillance based on pathology results.                           - Return to GI clinic PRN. Gerrit Heck, MD 04/04/2020 12:28:36 PM

## 2020-04-04 NOTE — Progress Notes (Signed)
Called to room to assist during endoscopic procedure.  Patient ID and intended procedure confirmed with present staff. Received instructions for my participation in the procedure from the performing physician.  

## 2020-04-04 NOTE — Patient Instructions (Signed)
Information on polyps, diverticulosis and hemorrhoids given to you today.  Await pathology results.  Resume previous diet and medications.  YOU HAD AN ENDOSCOPIC PROCEDURE TODAY AT THE [facility]:   Refer to the procedure report that was given to you for any specific questions about what was found during the examination.  If the procedure report does not answer your questions, please call your gastroenterologist to clarify.  If you requested that your care partner not be given the details of your procedure findings, then the procedure report has been included in a sealed envelope for you to review at your convenience later.  YOU SHOULD EXPECT: Some feelings of bloating in the abdomen. Passage of more gas than usual.  Walking can help get rid of the air that was put into your GI tract during the procedure and reduce the bloating. If you had a lower endoscopy (such as a colonoscopy or flexible sigmoidoscopy) you may notice spotting of blood in your stool or on the toilet paper. If you underwent a bowel prep for your procedure, you may not have a normal bowel movement for a few days.  Please Note:  You might notice some irritation and congestion in your nose or some drainage.  This is from the oxygen used during your procedure.  There is no need for concern and it should clear up in a day or so.  SYMPTOMS TO REPORT IMMEDIATELY:   Following lower endoscopy (colonoscopy or flexible sigmoidoscopy):  Excessive amounts of blood in the stool  Significant tenderness or worsening of abdominal pains  Swelling of the abdomen that is new, acute  Fever of 100F or higher   For urgent or emergent issues, a gastroenterologist can be reached at any hour by calling [phone]. Do not use MyChart messaging for urgent concerns.    DIET:  We do recommend a small meal at first, but then you may proceed to your regular diet.  Drink plenty of fluids but you should avoid alcoholic beverages for 24  hours.  ACTIVITY:  You should plan to take it easy for the rest of today and you should NOT DRIVE or use heavy machinery until tomorrow (because of the sedation medicines used during the test).    FOLLOW UP: Our staff will call the number listed on your records 48-72 hours following your procedure to check on you and address any questions or concerns that you may have regarding the information given to you following your procedure. If we do not reach you, we will leave a message.  We will attempt to reach you two times.  During this call, we will ask if you have developed any symptoms of COVID 19. If you develop any symptoms (ie: fever, flu-like symptoms, shortness of breath, cough etc.) before then, please call [phone].  If you test positive for Covid 19 in the 2 weeks post procedure, please call and report this information to us.    If any biopsies were taken you will be contacted by phone or by letter within the next 1-3 weeks.  Please call us at [phone] if you have not heard about the biopsies in 3 weeks.    SIGNATURES/CONFIDENTIALITY: You and/or your care partner have signed paperwork which will be entered into your electronic medical record.  These signatures attest to the fact that that the information above on your After Visit Summary has been reviewed and is understood.  Full responsibility of the confidentiality of this discharge information lies with you   and/or your care-partner. 

## 2020-04-04 NOTE — Progress Notes (Signed)
Report to PACU, RN, vss, BBS= Clear.  

## 2020-04-04 NOTE — Progress Notes (Signed)
Temp by LC Vitals by DT   Pt's states no medical or surgical changes since previsit or office visit.

## 2020-04-06 ENCOUNTER — Telehealth: Payer: Self-pay | Admitting: *Deleted

## 2020-04-06 ENCOUNTER — Encounter: Payer: Self-pay | Admitting: Gastroenterology

## 2020-04-06 NOTE — Telephone Encounter (Signed)
  Follow up Call-  Call back number 04/04/2020  Post procedure Call Back phone  # [phone]  Permission to leave phone message Yes  Some recent data might be hidden     Patient questions:  Do you have a fever, pain , or abdominal swelling? No. Pain Score  0 *  Have you tolerated food without any problems? Yes.    Have you been able to return to your normal activities? Yes.    Do you have any questions about your discharge instructions: Diet   No. Medications  No. Follow up visit  No.  Do you have questions or concerns about your Care? No.  Actions: * If pain score is 4 or above: No action needed, pain <4.  1. Have you developed a fever since your procedure? NO  2.   Have you had an respiratory symptoms (SOB or cough) since your procedure? NO  3.   Have you tested positive for COVID 19 since your procedure NO  4.   Have you had any family members/close contacts diagnosed with the COVID 19 since your procedure?  NO   If yes to any of these questions please route to Joylene John, RN and Erenest Rasher, RN

## 2020-04-06 NOTE — Telephone Encounter (Signed)
  Follow up Call-  Call back number 04/04/2020  Post procedure Call Back phone  # [phone]  Permission to leave phone message Yes  Some recent data might be hidden     Patient questions:  Message left to call us if necessary.

## 2020-10-17 ENCOUNTER — Other Ambulatory Visit: Payer: Self-pay | Admitting: Nephrology

## 2020-10-17 DIAGNOSIS — R809 Proteinuria, unspecified: Secondary | ICD-10-CM

## 2020-10-17 DIAGNOSIS — E876 Hypokalemia: Secondary | ICD-10-CM

## 2020-10-17 DIAGNOSIS — R0989 Other specified symptoms and signs involving the circulatory and respiratory systems: Secondary | ICD-10-CM

## 2020-12-05 ENCOUNTER — Other Ambulatory Visit: Payer: Self-pay

## 2020-12-05 ENCOUNTER — Encounter: Payer: Self-pay | Admitting: Obstetrics and Gynecology

## 2020-12-05 ENCOUNTER — Ambulatory Visit (INDEPENDENT_AMBULATORY_CARE_PROVIDER_SITE_OTHER): Payer: No Typology Code available for payment source | Admitting: Obstetrics and Gynecology

## 2020-12-05 VITALS — BP 130/80 | Ht 65.0 in | Wt 195.0 lb

## 2020-12-05 DIAGNOSIS — N907 Vulvar cyst: Secondary | ICD-10-CM | POA: Diagnosis not present

## 2020-12-05 DIAGNOSIS — L309 Dermatitis, unspecified: Secondary | ICD-10-CM

## 2020-12-05 MED ORDER — CLOBETASOL PROPIONATE 0.05 % EX OINT: TOPICAL_OINTMENT | CUTANEOUS | 1 refills | Status: DC

## 2020-12-05 NOTE — Progress Notes (Signed)
Rhonda Gibbs July 21, 1971 [id number]  SUBJECTIVE:  50 y.o. G3P3003 female previously established patient last seen in this office in 2017 presents for evaluation of labial lesions and vaginal itching.  She has been seen through the [place] [facility], and to address the external vaginal/vulvar itching she has been given a triamcinolone 0.1% cream which helps when she applies it but the itching then returns a few days later.  She denies any previous history of this kind of condition and no previous abnormalities with Pap smears.  She has had a prior hysterectomy.  No new sexual partners or STI concerns.  Current Outpatient Medications  Medication Sig Dispense Refill  . alprazolam (XANAX) 2 MG tablet Take 2 mg by mouth 3 times/day as needed-between meals & bedtime for sleep. 1 tab PO 1 hour before EMG.    Marland Kitchen atenolol (TENORMIN) 50 MG tablet Take 100 mg by mouth daily.     . carboxymethylcellulose (REFRESH PLUS) 0.5 % SOLN 1 drop 3 (three) times daily as needed.    . clobetasol ointment (TEMOVATE) 0.05 % Apply thin film to external vulvar area nightly for 1 month, then switch to every other night for 2 weeks, then decrease use to 2 nights per week only. Wash hands after use 60 g 1  . fluticasone (FLONASE) 50 MCG/ACT nasal spray Place 2 sprays into both nostrils 2 (two) times daily.    . indomethacin (INDOCIN) 50 MG capsule Take 50 mg by mouth 2 (two) times daily with a meal. Take 1 tablet by mouth twice daily.    Marland Kitchen lidocaine (LIDODERM) 5 % Place 1 patch onto the skin daily. Remove & Discard patch within 12 hours or as directed by MD    . lubiprostone (AMITIZA) 24 MCG capsule Take 24 mcg by mouth daily. Take 1 capsule by mouth once daily.    Marland Kitchen MENTHOL-METHYL SALICYLATE EX Apply topically. Apply small amount to affected area four times daily.    . Multiple Vitamin (MULTIVITAMIN ADULT PO) Take by mouth.    Marland Kitchen omeprazole (PRILOSEC) 20 MG capsule Take 20 mg by mouth daily. Take 1 capsule by mouth once  daily in the am..    . polyethylene glycol (MIRALAX / GLYCOLAX) 17 g packet Take 17 g by mouth daily.    . polyethylene glycol powder (GLYCOLAX/MIRALAX) 17 GM/SCOOP powder Take 255 g by mouth daily. 119 g 0  . PRAZOSIN HCL PO Take 2 mg by mouth.    . psyllium (METAMUCIL) 58.6 % packet Take 1 packet by mouth daily.    . sertraline (ZOLOFT) 100 MG tablet Take 500 mg by mouth daily.     Marland Kitchen spironolactone-hydrochlorothiazide (ALDACTAZIDE) 25-25 MG tablet Take 1 tablet by mouth daily.    . SUMAtriptan (IMITREX) 100 MG tablet Take 100 mg by mouth every 2 (two) hours as needed for migraine. First dose at onset of headache, then repeat in 2 hours PRN.    Marland Kitchen tiZANidine (ZANAFLEX) 4 MG capsule Take 4 mg by mouth 3 (three) times daily.    Marland Kitchen topiramate (TOPAMAX) 25 MG tablet Take 25 mg by mouth daily. Take 3 tablets by mouth every morning.    . traZODone (DESYREL) 50 MG tablet Take 50 mg by mouth at bedtime.     No current facility-administered medications for this visit.   Allergies: No known allergies  No LMP recorded. Patient has had a hysterectomy.  Past medical history,surgical history, problem list, medications, allergies, family history and social history were all reviewed and  documented as reviewed in the EPIC chart.  ROS: Positives and negatives as reviewed in HPI  OBJECTIVE:  BP 130/80 (BP Location: Right Arm, Patient Position: Sitting, Cuff Size: Normal)   Ht 5\' 5"  (1.651 m)   Wt 195 lb (88.5 kg)   BMI 32.45 kg/m  The patient appears well, alert, oriented, in no distress. PELVIC EXAM: VULVA: normal appearing vulva with no masses, tenderness, several small firm nodular areas <0.5 cm in size appear to be consistent with sebaceous cysts, anteriorly there are "parchment paper like" thin skin changes but no appreciable fissures or concerning lesions, VAGINA: normal appearing vagina with normal color and discharge, no lesions  Chaperone: Bonham present during the  examination  ASSESSMENT:  50 y.o. 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  Vulvar sebaceous cysts.  We discussed that this is not worrisome finding and if bothersome to her she could try doing sitz bath's and/or warm compresses to help with spontaneous resolution.  No further intervention would be necessary unless they were to multiply in number and become more symptomatic. 2.  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  We discussed lichen sclerosis and similar related conditions.  Does not appear that she has candidiasis.  The anti-inflammatory action of a lower potency steroid cream does help her, but typically these conditions require a more potent topical steroid so I recommended trying clobetasol ointment 0.05% nightly for 1 month and then switch to every other night for 2 weeks and then decrease use to 2 nights per week indefinitely.  I gave her a printed prescription upon her request for this medication and I would like her to try it for 6 to 8 weeks and if no improvement in her symptoms then we will have her see [id number] back for further evaluation, and possibly a biopsy of the area.  [place] MD 12/05/20

## 2020-12-29 ENCOUNTER — Other Ambulatory Visit: Payer: Self-pay | Admitting: Internal Medicine

## 2021-01-03 ENCOUNTER — Other Ambulatory Visit: Payer: Self-pay | Admitting: Internal Medicine

## 2021-01-03 DIAGNOSIS — Z1231 Encounter for screening mammogram for malignant neoplasm of breast: Secondary | ICD-10-CM

## 2021-02-06 IMAGING — MG DIGITAL DIAGNOSTIC BILAT W/ TOMO
7 series · 8 of 23 positions shown · non-contrast
Comparison: Previous exam(s).

CLINICAL DATA: 48-year-old female with INNER LEFT breast pain for 2
weeks.

EXAM:
DIGITAL DIAGNOSTIC BILATERAL MAMMOGRAM WITH CAD AND TOMO
ULTRASOUND LEFT BREAST

[L MLO synth-2D]
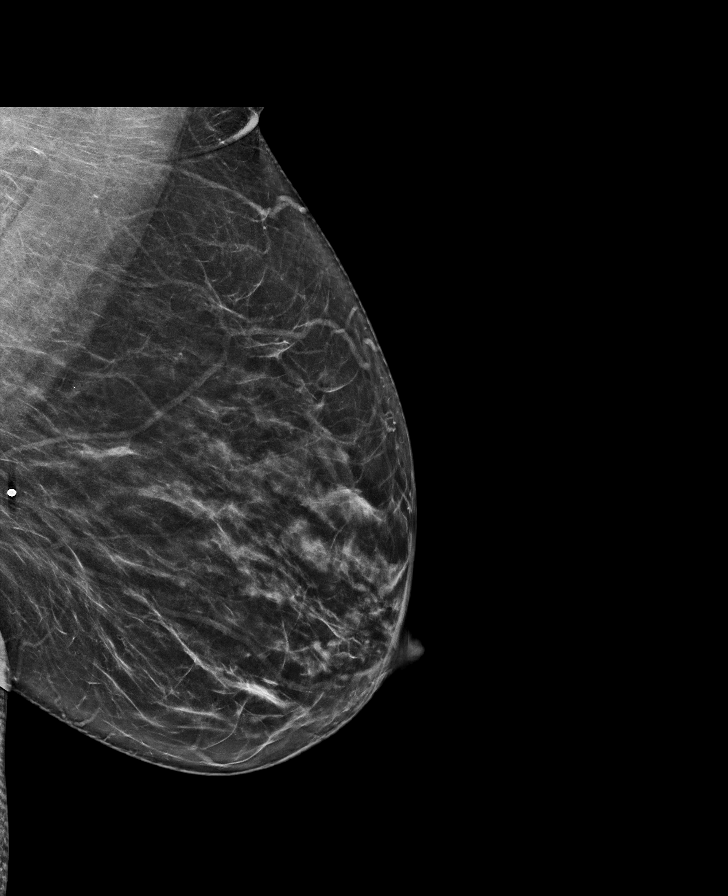

[R MLO synth-2D]
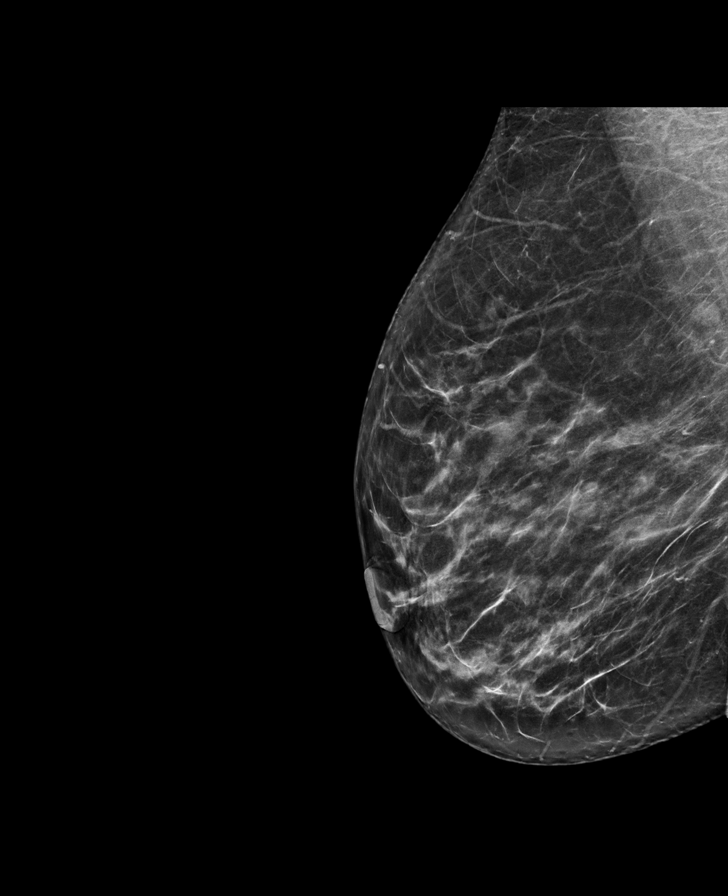

[L TAN synth-2D]
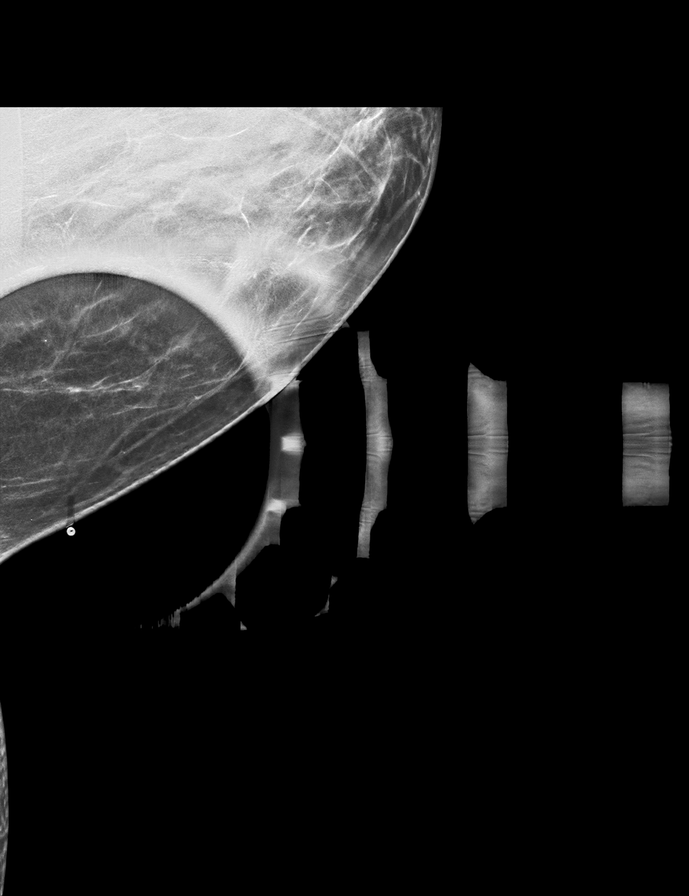

[L TAN tomo · 2 of 46 frames shown]
[frame 15/46]
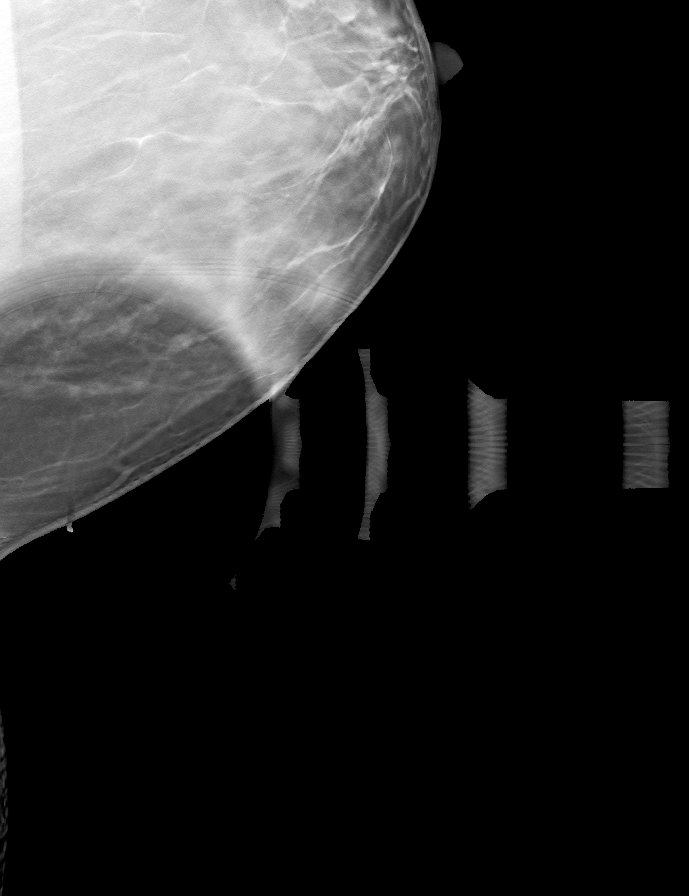
[frame 23/46]
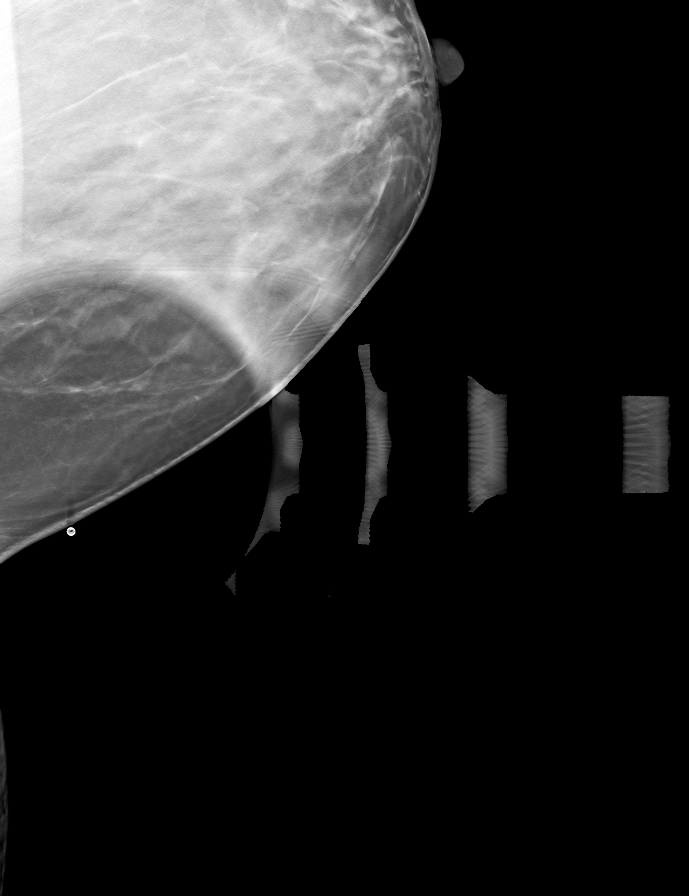

[L MLO tomo · tomo slice 37/73.0]
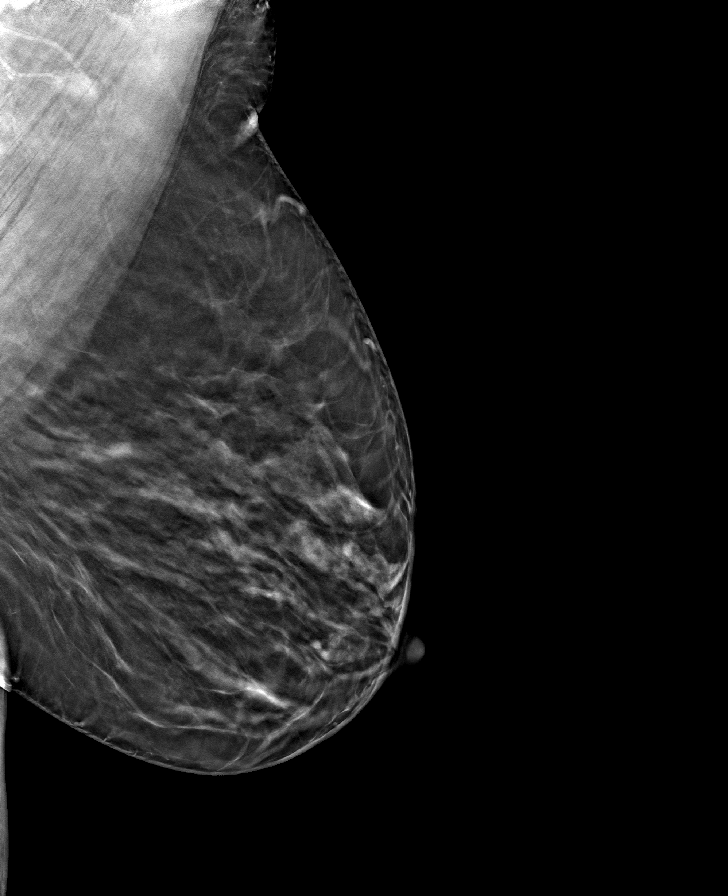

[L CC tomo · tomo slice 32/63.0]
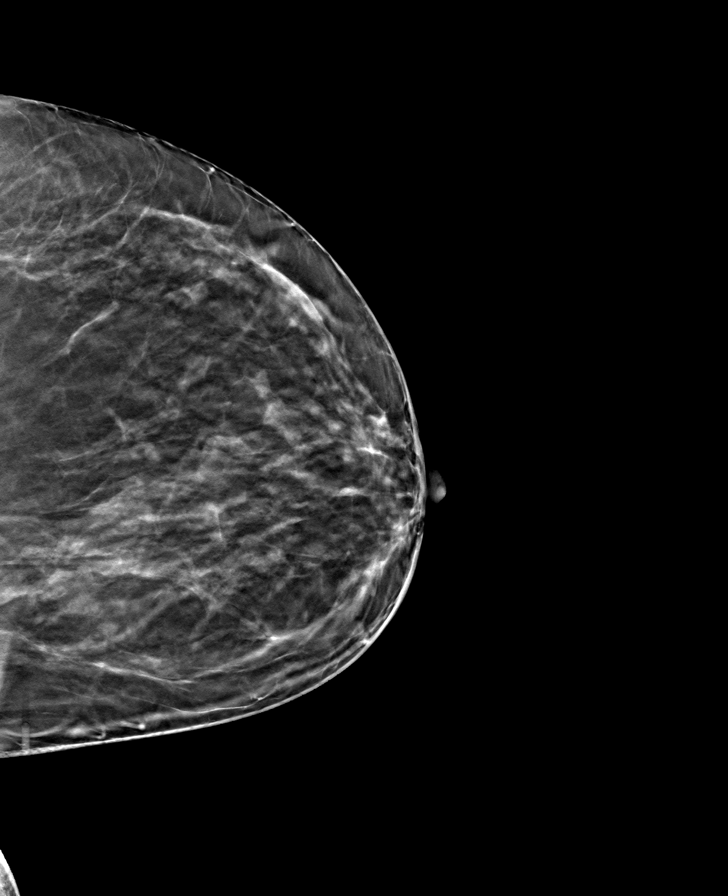

[R MLO tomo · tomo slice 36/71.0]
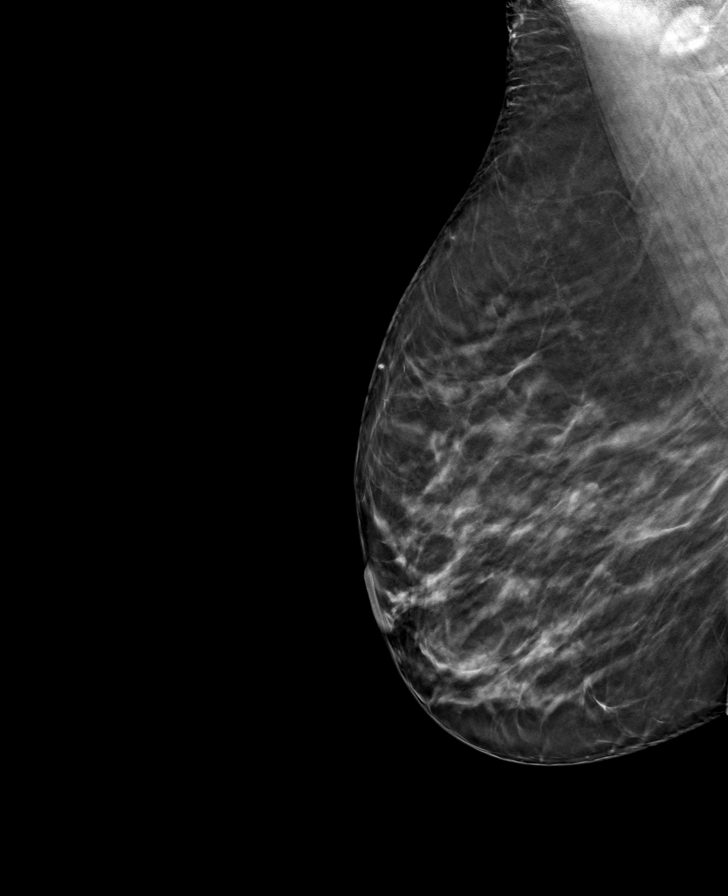

[8 of 23 positions shown; findings below may reference images not displayed]

ACR Breast Density Category c: The breast tissue is heterogeneously
dense, which may obscure small masses.
FINDINGS: 2D/3D full field views of both breasts demonstrate no suspicious
mass, distortion or worrisome calcifications.

Mammographic images were processed with CAD.

On physical exam, no palpable abnormalities are identified within
the INNER LEFT breast in the areas of patient's pain.

Targeted ultrasound is performed, showing no abnormalities within
the INNER LEFT breast, including solid or cystic mass, distortion or
abnormal shadowing.
IMPRESSION: 1. No mammographic, palpable or sonographic abnormality within the
INNER LEFT breast, in the areas of patient's pain.
2. No mammographic evidence of breast malignancy.

RECOMMENDATION:
Bilateral screening mammogram in 1 year.

I have discussed the findings, causes of breast pain and
recommendations with the patient. If applicable, a reminder letter
will be sent to the patient regarding the next appointment.

BI-RADS CATEGORY  1: Negative.

## 2021-03-19 ENCOUNTER — Encounter: Payer: Self-pay | Admitting: Internal Medicine

## 2021-05-09 ENCOUNTER — Ambulatory Visit: Payer: Non-veteran care | Admitting: Internal Medicine

## 2021-08-22 ENCOUNTER — Encounter: Payer: Self-pay | Admitting: Gastroenterology

## 2021-08-22 NOTE — H&P (View-Only) (Signed)
Referring Provider: [facility] Primary Care Physician:  [place] [facility] Gastroenterologist:  Dr. Abbey Chatters  Chief Complaint  Patient presents with   Nausea    Occ, no vomiting   Abdominal Pain   Constipation    Sometimes goes 2 weeks without bm     HPI:   Rhonda Gibbs is a 50 y.o. female presenting today at the request of [facility] for abdominal pain.  Reviewed referral information:  Patient with chronic intermittent generalized abdominal pain since hernia repair in 2015-2016 described as sharp or crampy with no associated factors and no alleviating or aggravating factors. Reports she had CT of the abdomen and pelvis in May 2021 with no pathology that explains cause of her pain.  Labs November 2021 with hemoglobin, AST, ALT, total bilirubin within normal limits.  Reviewed CT A/P without IV contrast in care everywhere completed 04/18/2020: Moderate fecal retention, 2 cm right adnexal cyst.  Normal-appearing gallbladder, pancreas, liver, bowel wall.  Today:  BP always runs high. States she will be seeing a kidney doctor for this. Hasn't taken meds this morning. Asymptomatic.  Sharp/cramping abdominal pain. Pain starts around umbilicus then becomes generalized. Started 4-5 years ago. 3-4 times a day. Last 10-15 minutes or less. Doesn't take anything. Will use a heating pad. Not associated with meals or bowel movements. Wakes at night with pain at times. Had an umbilical hernia repair in 2015 at the [facility] in [place]. Saw surgeon in 2016 and was told it wasn't from the hernia repair. Associated nausea without vomiting.   Reports reflux symptoms only if eating fried foods.  Nothing routine.  Takes omeprazole as needed which works well.  Admits to solid food dysphagia.  Last occurred in March with chicken.  She does require regurgitation.  No prior EGD.  Bother diagnosed with stomach cancer last year- age 84.   Takes BC powders for  migraines. Was taking about twice a week.   Struggles with constipation. Can go 2 weeks without a BM. Hasn't noticed a difference with starting Amitiza 24 mcg daily.  Has never taken Amitiza twice a day.  Also taking MiraLAX daily. Reports sharp rectal pain intermittently.  Can occur with bowel movements or randomly. 1 episode of toilet tissue hematochezia 2 months ago. Stools are hard and require straining. No melena.  No unintentional weight loss.   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 "borderline"   Osteoporosis    Sleep apnea    has a cpap but does not use   Thyroid disease     Past Surgical History:  Procedure Laterality Date   ABDOMINAL HYSTERECTOMY     partial   CHONDROPLASTY Left 08/03/2015   Procedure: CHONDROPLASTY;  Surgeon: [name], MD;  Location: [place];  Service: Orthopedics;  Laterality: Left;   CLOSED MANIPULATION KNEE WITH STERIOD INJECTION Left 02/06/2017   Procedure: Closed MANIPULATION of knee with Steroid Injection;  Surgeon: [name], MD;  Location: [place];  Service: Orthopedics;  Laterality: Left;   COLONOSCOPY  04/04/2020   Dr. Bryan Lemma; 5 tubular adenomas in the sigmoid and ascending colon, multiple hyperplastic polyp in the rectum, diverticulosis in the sigmoid colon, nonbleeding internal hemorrhoids.  Repeat in 3 years.   HERNIA REPAIR     umbilical   JOINT REPLACEMENT Left 06/26/2016   Dr Percell Miller   KNEE ARTHROSCOPY WITH LATERAL RELEASE Left 08/03/2015   Procedure: LEFT KNEE ARTHROSCOPY  CHONDROPLASTY WITH LATERAL RELEASE;  Surgeon: [name], MD;  Location: [place];  Service: Orthopedics;  Laterality: Left;   TOTAL KNEE ARTHROPLASTY Left 06/26/2016   Procedure: TOTAL KNEE ARTHROPLASTY;  Surgeon: [name], MD;  Location: [place];  Service: Orthopedics;  Laterality: Left;    Current Outpatient Medications  Medication Sig Dispense Refill   alprazolam (XANAX) 2 MG tablet Take 2 mg by mouth 3 times/day as needed-between meals & bedtime for sleep. 1 tab PO 1 hour before EMG.     atenolol (TENORMIN) 50 MG tablet Take 100 mg by mouth daily.      carboxymethylcellulose (REFRESH PLUS) 0.5 % SOLN 1 drop 3 (three) times daily as needed.     chlorthalidone (HYGROTON) 25 MG tablet Take 25 mg by mouth daily.     clobetasol ointment (TEMOVATE) 0.05 % Apply thin film to external vulvar area nightly for 1 month, then switch to every other night for 2 weeks, then decrease use to 2 nights per week only. Wash hands after use 60 g 1   fluticasone (FLONASE) 50 MCG/ACT nasal spray Place 2 sprays into both nostrils 2 (two) times daily.     hydrocortisone (ANUSOL-HC) 2.5 % rectal cream Place 1 application rectally 2 (two) times daily. 30 g 1   indomethacin (INDOCIN) 50 MG capsule Take 50 mg by mouth 2 (two) times daily with a meal. Take 1 tablet by mouth twice daily.     lidocaine (LIDODERM) 5 % Place 1 patch onto the skin daily. Remove & Discard patch within 12 hours or as directed by MD     MENTHOL-METHYL SALICYLATE EX Apply topically. Apply small amount to affected area four times daily.     Multiple Vitamin (MULTIVITAMIN ADULT PO) Take by mouth.     omeprazole (PRILOSEC) 20 MG capsule Take 20 mg by mouth daily. Take 1 capsule by mouth once daily in the am..     polyethylene glycol (MIRALAX / GLYCOLAX) 17 g packet Take 17 g by mouth daily.     PRAZOSIN HCL PO Take 2 mg by mouth.     psyllium (METAMUCIL) 58.6 % packet Take 1 packet by mouth daily.     Rimegepant Sulfate 75 MG TBDP Take 1 tablet by mouth as needed.     sertraline (ZOLOFT) 100 MG tablet Take 500 mg by  mouth daily.      spironolactone (ALDACTONE) 25 MG tablet Take 1 tablet by mouth daily.     SUMAtriptan (IMITREX) 100 MG tablet Take 100 mg by mouth every 2 (two) hours as needed for migraine. First dose at onset of headache, then repeat in 2 hours PRN.     tiZANidine (ZANAFLEX) 4 MG capsule Take 4 mg by mouth 3 (three) times daily.     traZODone (DESYREL) 50 MG tablet Take 50 mg by mouth at bedtime.     No current facility-administered medications for this visit.    Allergies as of 08/23/2021 - Review Complete 08/23/2021  Allergen Reaction Noted   No known allergies  06/25/2016  Family History  Problem Relation Age of Onset   Hypertension Mother    Thyroid disease Mother        Had surgery, unknown diagnosis   Diabetes Father    Hypertension Father    Heart disease Father        chf   Thyroid disease Sister    Hypertension Brother    Stomach cancer Brother    Gastric cancer Brother    Colon cancer Maternal Grandmother    Stomach cancer Maternal Grandmother    Gastric cancer Maternal Grandmother    Cancer Maternal Grandfather        colon   Cancer Paternal Grandmother        cervical or colon????   Cancer Cousin        prostate   Thyroid cancer Neg Hx    Colon polyps Neg Hx    Esophageal cancer Neg Hx     Social History   Socioeconomic History   Marital status: Married    Spouse name: Not on file   Number of children: Not on file   Years of education: Not on file   Highest education level: Not on file  Occupational History   Not on file  Tobacco Use   Smoking status: Never   Smokeless tobacco: Never  Vaping Use   Vaping Use: Never used  Substance and Sexual Activity   Alcohol use: Not Currently   Drug use: No   Sexual activity: Yes    Birth control/protection: Surgical  Other Topics Concern   Not on file  Social History Narrative   Not on file   Social Determinants of Health   Financial Resource Strain: Not on file  Food Insecurity: Not on file   Transportation Needs: Not on file  Physical Activity: Not on file  Stress: Not on file  Social Connections: Not on file  Intimate Partner Violence: Not on file    Review of Systems: Gen: Denies any fever, chills, cold or flulike symptoms, presyncope, syncope. CV: Denies chest pain.  Reports occasionally feeling a little irregularity in her heart rate, but nothing significant. Resp: Denies shortness or cough. GI: See HPI GU : Denies urinary burning, urinary frequency, urinary hesitancy MS: Chronic joint pain. Derm: Denies rash Heme: See HPI  Physical Exam: BP (!) 170/120 (BP Location: Right Arm)   Pulse (!) 107   Temp (!) 97.5 F (36.4 C) (Temporal)   Ht 5\' 5"  (1.651 m)   Wt 188 lb 3.2 oz (85.4 kg)   BMI 31.32 kg/m  General:   Alert and oriented. Pleasant and cooperative. Well-nourished and well-developed.  Walking with a cane. Head:  Normocephalic and atraumatic. Eyes:  Without icterus, sclera clear and conjunctiva pink.  Ears:  Normal auditory acuity. Lungs:  Clear to auscultation bilaterally. No wheezes, rales, or rhonchi. No distress.  Heart:  S1, S2 present without murmurs appreciated.  Abdomen:  +BS, soft, and non-distended. Mild TTP in epigastric area. No HSM noted. No guarding or rebound. No masses appreciated.  Rectal:  Declined Msk:  Symmetrical without gross deformities. Normal posture. Extremities:  Without edema. Neurologic:  Alert and  oriented x4;  grossly normal neurologically. Skin:  Intact without significant lesions or rashes. Psych:  Normal mood and affect.    Assessment: 50 year old female presenting today at the request of [facility] for further evaluation of generalized abdominal pain.  Patient also reports chronic constipation and rectal pain.  Abdominal pain:  4-5-year history of intermittent sharp/cramping  periumbilical abdominal pain that becomes generalized with no identified aggravating or relieving factors lasting up to  10-15 minutes.  Reports symptoms started after umbilical hernia repair in 2015. Denies association with meals or bowel movements.  Associated nausea without vomiting.  Denies reflux unless eating fried foods for which she takes omeprazole as needed.  She does have significant constipation as per below. Prior CT A/P without IV contrast in May 2021 for the same symptoms with fecal retention, 2 cm right adnexal cyst, no other significant findings.  Colonoscopy May 2021 with multiple adenomatous colon polyps, due for repeat in 2024.  No prior EGD. She does have family history significant for brother with stomach cancer diagnosed at age 44 and maternal grandmother with history of stomach cancer. Mild TTP in the epigastric area on exam today.   Differentials include adhesive disease, constipation, NSAID enteropathy in the setting of BC powders, and can't rule out gastritis/PUD/gastric cancer though I feel this is less likely.   Will focus on better management of constipation, discontinue NSAIDs, and arrange EGD with possible dilation of her esophagus due to dysphagia as discussed below which will also help rule out gastritis/PUD/ gastric malignancy.  Constipation: Chronic.  Not adequately managed on Amitiza 24 mcg daily and MiraLAX daily.  Single episode of toilet tissue hematochezia about 2 months ago in the setting of straining.  Known history of hemorrhoids as well as multiple adenomatous colon polyps on colonoscopy in May 2021, due for repeat in 2024.  We will try changing Amitiza to Linzess 290 mcg.  Rectal pain: Intermittent sharp rectal pain that can occur with or without bowel movements.  Known history of hemorrhoids.  Offered rectal exam today, but patient declined.  Symptoms may be related to hemorrhoids or anal fissure in the setting of significant constipation addressed above.  For now, we will treat as hemorrhoids with Anusol rectal cream.  Dysphagia: Intermittent solid food dysphagia, primarily  with meats.  Associated regurgitation.  Denies reflux/heartburn unless eating fried foods for which she takes omeprazole as needed.  No prior EGD.  Differentials include esophageal web, ring, stricture, esophageal dysmotility.  I have recommended EGD with possible dilation of her esophagus for further evaluation and therapeutic intervention.  HTN: BP significantly elevated today at 179/112.  Patient reports this is a chronic issue for her, but she has not taken any blood pressure medications this morning which likely accounts for the elevation.  She is asymptomatic to this.  Not interested in acute medical attention as she can take her medications when she returns home.  As we have plan to proceed with EGD in the near future, she was advised if her blood pressure is significantly elevated on the day of her procedure, anesthesia will cancel.  She will need to take her blood pressure medications the morning of her procedure.   Plan: EGD +/- dilation with propofol with Dr. Abbey Chatters in the near future. The risks, benefits, and alternatives have been discussed with the patient in detail. The patient states understanding and desires to proceed. ASA 3 Patient is to take blood pressure medications morning of procedure. She will continue omeprazole as needed for now.  May need to increase to daily depending on EGD findings. Stop BC powders and avoid all NSAIDs. Use Tylenol as needed.  No more than 3000 mg/day. Stop Amitiza and MiraLAX. Start Linzess 290 mcg daily 30 minutes before first meal.  Samples provided.  Requested progress report in 1-2 weeks. Anusol rectal cream twice daily. Avoid straining,  limit toilet time to 2-3 minutes, sitz bath's as needed. Reinforced importance of taking BP medications as soon as she gets home today. Follow-up in about 3 months.    Aliene Altes, PA-C [facility] 08/23/2021

## 2021-08-22 NOTE — Progress Notes (Signed)
Referring Provider: [facility] Primary Care Physician:  [facility] Primary Gastroenterologist:  Dr. Abbey Chatters  Chief Complaint  Patient presents with   Nausea    Occ, no vomiting   Abdominal Pain   Constipation    Sometimes goes 2 weeks without bm     HPI:   Rhonda Gibbs is a 50 y.o. female presenting today at the request of [facility] for abdominal pain.  Reviewed referral information:  Patient with chronic intermittent generalized abdominal pain since hernia repair in 2015-2016 described as sharp or crampy with no associated factors and no alleviating or aggravating factors. Reports she had CT of the abdomen and pelvis in May 2021 with no pathology that explains cause of her pain.  Labs November 2021 with hemoglobin, AST, ALT, total bilirubin within normal limits.  Reviewed CT A/P without IV contrast in care everywhere completed 04/18/2020: Moderate fecal retention, 2 cm right adnexal cyst.  Normal-appearing gallbladder, pancreas, liver, bowel wall.  Today:  BP always runs high. States she will be seeing a kidney doctor for this. Hasn't taken meds this morning. Asymptomatic.  Sharp/cramping abdominal pain. Pain starts around umbilicus then becomes generalized. Started 4-5 years ago. 3-4 times a day. Last 10-15 minutes or less. Doesn't take anything. Will use a heating pad. Not associated with meals or bowel movements. Wakes at night with pain at times. Had an umbilical hernia repair in 2015 at the [facility] in [place]. Saw surgeon in 2016 and was told it wasn't from the hernia repair. Associated nausea without vomiting.   Reports reflux symptoms only if eating fried foods.  Nothing routine.  Takes omeprazole as needed which works well.  Admits to solid food dysphagia.  Last occurred in March with chicken.  She does require regurgitation.  No prior EGD.  Bother diagnosed with stomach cancer last year- age 25.   Takes BC powders for  migraines. Was taking about twice a week.   Struggles with constipation. Can go 2 weeks without a BM. Hasn't noticed a difference with starting Amitiza 24 mcg daily.  Has never taken Amitiza twice a day.  Also taking MiraLAX daily. Reports sharp rectal pain intermittently.  Can occur with bowel movements or randomly. 1 episode of toilet tissue hematochezia 2 months ago. Stools are hard and require straining. No melena.  No unintentional weight loss.   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 "borderline"   Osteoporosis    Sleep apnea    has a cpap but does not use   Thyroid disease     Past Surgical History:  Procedure Laterality Date   ABDOMINAL HYSTERECTOMY     partial   CHONDROPLASTY Left 08/03/2015   Procedure: CHONDROPLASTY;  Surgeon: [name], MD;  Location: [place];  Service: Orthopedics;  Laterality: Left;   CLOSED MANIPULATION KNEE WITH STERIOD INJECTION Left 02/06/2017   Procedure: Closed MANIPULATION of knee with Steroid Injection;  Surgeon: [name], MD;  Location: [place];  Service: Orthopedics;  Laterality: Left;   COLONOSCOPY  04/04/2020   Dr. Bryan Lemma; 5 tubular adenomas in the sigmoid and ascending colon, multiple hyperplastic polyp in the rectum, diverticulosis in the sigmoid colon, nonbleeding internal hemorrhoids.  Repeat in 3 years.   HERNIA REPAIR     umbilical   JOINT REPLACEMENT Left 06/26/2016   Dr Percell Miller   KNEE ARTHROSCOPY WITH LATERAL RELEASE Left 08/03/2015   Procedure: LEFT KNEE ARTHROSCOPY  CHONDROPLASTY WITH LATERAL RELEASE;  Surgeon: [name], MD;  Location: [place];  Service: Orthopedics;  Laterality: Left;   TOTAL KNEE ARTHROPLASTY Left 06/26/2016   Procedure: TOTAL KNEE ARTHROPLASTY;  Surgeon: [name], MD;  Location: [place];  Service: Orthopedics;  Laterality: Left;    Current Outpatient Medications  Medication Sig Dispense Refill   alprazolam (XANAX) 2 MG tablet Take 2 mg by mouth 3 times/day as needed-between meals & bedtime for sleep. 1 tab PO 1 hour before EMG.     atenolol (TENORMIN) 50 MG tablet Take 100 mg by mouth daily.      carboxymethylcellulose (REFRESH PLUS) 0.5 % SOLN 1 drop 3 (three) times daily as needed.     chlorthalidone (HYGROTON) 25 MG tablet Take 25 mg by mouth daily.     clobetasol ointment (TEMOVATE) 0.05 % Apply thin film to external vulvar area nightly for 1 month, then switch to every other night for 2 weeks, then decrease use to 2 nights per week only. Wash hands after use 60 g 1   fluticasone (FLONASE) 50 MCG/ACT nasal spray Place 2 sprays into both nostrils 2 (two) times daily.     hydrocortisone (ANUSOL-HC) 2.5 % rectal cream Place 1 application rectally 2 (two) times daily. 30 g 1   indomethacin (INDOCIN) 50 MG capsule Take 50 mg by mouth 2 (two) times daily with a meal. Take 1 tablet by mouth twice daily.     lidocaine (LIDODERM) 5 % Place 1 patch onto the skin daily. Remove & Discard patch within 12 hours or as directed by MD     MENTHOL-METHYL SALICYLATE EX Apply topically. Apply small amount to affected area four times daily.     Multiple Vitamin (MULTIVITAMIN ADULT PO) Take by mouth.     omeprazole (PRILOSEC) 20 MG capsule Take 20 mg by mouth daily. Take 1 capsule by mouth once daily in the am..     polyethylene glycol (MIRALAX / GLYCOLAX) 17 g packet Take 17 g by mouth daily.     PRAZOSIN HCL PO Take 2 mg by mouth.     psyllium (METAMUCIL) 58.6 % packet Take 1 packet by mouth daily.     Rimegepant Sulfate 75 MG TBDP Take 1 tablet by mouth as needed.     sertraline (ZOLOFT) 100 MG tablet Take 500 mg by  mouth daily.      spironolactone (ALDACTONE) 25 MG tablet Take 1 tablet by mouth daily.     SUMAtriptan (IMITREX) 100 MG tablet Take 100 mg by mouth every 2 (two) hours as needed for migraine. First dose at onset of headache, then repeat in 2 hours PRN.     tiZANidine (ZANAFLEX) 4 MG capsule Take 4 mg by mouth 3 (three) times daily.     traZODone (DESYREL) 50 MG tablet Take 50 mg by mouth at bedtime.     No current facility-administered medications for this visit.    Allergies as of 08/23/2021 - Review Complete 08/23/2021  Allergen Reaction Noted   No known allergies  06/25/2016  Family History  Problem Relation Age of Onset   Hypertension Mother    Thyroid disease Mother        Had surgery, unknown diagnosis   Diabetes Father    Hypertension Father    Heart disease Father        chf   Thyroid disease Sister    Hypertension Brother    Stomach cancer Brother    Gastric cancer Brother    Colon cancer Maternal Grandmother    Stomach cancer Maternal Grandmother    Gastric cancer Maternal Grandmother    Cancer Maternal Grandfather        colon   Cancer Paternal Grandmother        cervical or colon????   Cancer Cousin        prostate   Thyroid cancer Neg Hx    Colon polyps Neg Hx    Esophageal cancer Neg Hx     Social History   Socioeconomic History   Marital status: Married    Spouse name: Not on file   Number of children: Not on file   Years of education: Not on file   Highest education level: Not on file  Occupational History   Not on file  Tobacco Use   Smoking status: Never   Smokeless tobacco: Never  Vaping Use   Vaping Use: Never used  Substance and Sexual Activity   Alcohol use: Not Currently   Drug use: No   Sexual activity: Yes    Birth control/protection: Surgical  Other Topics Concern   Not on file  Social History Narrative   Not on file   Social Determinants of Health   Financial Resource Strain: Not on file  Food Insecurity: Not on file   Transportation Needs: Not on file  Physical Activity: Not on file  Stress: Not on file  Social Connections: Not on file  Intimate Partner Violence: Not on file    Review of Systems: Gen: Denies any fever, chills, cold or flulike symptoms, presyncope, syncope. CV: Denies chest pain.  Reports occasionally feeling a little irregularity in her heart rate, but nothing significant. Resp: Denies shortness or cough. GI: See HPI GU : Denies urinary burning, urinary frequency, urinary hesitancy MS: Chronic joint pain. Derm: Denies rash Heme: See HPI  Physical Exam: BP (!) 170/120 (BP Location: Right Arm)   Pulse (!) 107   Temp (!) 97.5 F (36.4 C) (Temporal)   Ht 5\' 5"  (1.651 m)   Wt 188 lb 3.2 oz (85.4 kg)   BMI 31.32 kg/m  General:   Alert and oriented. Pleasant and cooperative. Well-nourished and well-developed.  Walking with a cane. Head:  Normocephalic and atraumatic. Eyes:  Without icterus, sclera clear and conjunctiva pink.  Ears:  Normal auditory acuity. Lungs:  Clear to auscultation bilaterally. No wheezes, rales, or rhonchi. No distress.  Heart:  S1, S2 present without murmurs appreciated.  Abdomen:  +BS, soft, and non-distended. Mild TTP in epigastric area. No HSM noted. No guarding or rebound. No masses appreciated.  Rectal:  Declined Msk:  Symmetrical without gross deformities. Normal posture. Extremities:  Without edema. Neurologic:  Alert and  oriented x4;  grossly normal neurologically. Skin:  Intact without significant lesions or rashes. Psych:  Normal mood and affect.    Assessment: 50 year old female presenting today at the request of [facility] for further evaluation of generalized abdominal pain.  Patient also reports chronic constipation and rectal pain.  Abdominal pain:  4-5-year history of intermittent sharp/cramping  periumbilical abdominal pain that becomes generalized with no identified aggravating or relieving factors lasting up to  10-15 minutes.  Reports symptoms started after umbilical hernia repair in 2015. Denies association with meals or bowel movements.  Associated nausea without vomiting.  Denies reflux unless eating fried foods for which she takes omeprazole as needed.  She does have significant constipation as per below. Prior CT A/P without IV contrast in May 2021 for the same symptoms with fecal retention, 2 cm right adnexal cyst, no other significant findings.  Colonoscopy May 2021 with multiple adenomatous colon polyps, due for repeat in 2024.  No prior EGD. She does have family history significant for brother with stomach cancer diagnosed at age 52 and maternal grandmother with history of stomach cancer. Mild TTP in the epigastric area on exam today.   Differentials include adhesive disease, constipation, NSAID enteropathy in the setting of BC powders, and can't rule out gastritis/PUD/gastric cancer though I feel this is less likely.   Will focus on better management of constipation, discontinue NSAIDs, and arrange EGD with possible dilation of her esophagus due to dysphagia as discussed below which will also help rule out gastritis/PUD/ gastric malignancy.  Constipation: Chronic.  Not adequately managed on Amitiza 24 mcg daily and MiraLAX daily.  Single episode of toilet tissue hematochezia about 2 months ago in the setting of straining.  Known history of hemorrhoids as well as multiple adenomatous colon polyps on colonoscopy in May 2021, due for repeat in 2024.  We will try changing Amitiza to Linzess 290 mcg.  Rectal pain: Intermittent sharp rectal pain that can occur with or without bowel movements.  Known history of hemorrhoids.  Offered rectal exam today, but patient declined.  Symptoms may be related to hemorrhoids or anal fissure in the setting of significant constipation addressed above.  For now, we will treat as hemorrhoids with Anusol rectal cream.  Dysphagia: Intermittent solid food dysphagia, primarily  with meats.  Associated regurgitation.  Denies reflux/heartburn unless eating fried foods for which she takes omeprazole as needed.  No prior EGD.  Differentials include esophageal web, ring, stricture, esophageal dysmotility.  I have recommended EGD with possible dilation of her esophagus for further evaluation and therapeutic intervention.  HTN: BP significantly elevated today at 179/112.  Patient reports this is a chronic issue for her, but she has not taken any blood pressure medications this morning which likely accounts for the elevation.  She is asymptomatic to this.  Not interested in acute medical attention as she can take her medications when she returns home.  As we have plan to proceed with EGD in the near future, she was advised if her blood pressure is significantly elevated on the day of her procedure, anesthesia will cancel.  She will need to take her blood pressure medications the morning of her procedure.   Plan: EGD +/- dilation with propofol with Dr. Abbey Chatters in the near future. The risks, benefits, and alternatives have been discussed with the patient in detail. The patient states understanding and desires to proceed. ASA 3 Patient is to take blood pressure medications morning of procedure. She will continue omeprazole as needed for now.  May need to increase to daily depending on EGD findings. Stop BC powders and avoid all NSAIDs. Use Tylenol as needed.  No more than 3000 mg/day. Stop Amitiza and MiraLAX. Start Linzess 290 mcg daily 30 minutes before first meal.  Samples provided.  Requested progress report in 1-2 weeks. Anusol rectal cream twice daily. Avoid straining,  limit toilet time to 2-3 minutes, sitz bath's as needed. Reinforced importance of taking BP medications as soon as she gets home today. Follow-up in about 3 months.    Aliene Altes, PA-C [facility] Gastroenterology 08/23/2021

## 2021-08-23 ENCOUNTER — Telehealth: Payer: Self-pay | Admitting: *Deleted

## 2021-08-23 ENCOUNTER — Encounter: Payer: Self-pay | Admitting: Internal Medicine

## 2021-08-23 ENCOUNTER — Telehealth: Payer: Self-pay | Admitting: Gastroenterology

## 2021-08-23 ENCOUNTER — Other Ambulatory Visit: Payer: Self-pay

## 2021-08-23 ENCOUNTER — Ambulatory Visit (INDEPENDENT_AMBULATORY_CARE_PROVIDER_SITE_OTHER): Payer: No Typology Code available for payment source | Admitting: Gastroenterology

## 2021-08-23 ENCOUNTER — Encounter: Payer: Self-pay | Admitting: Gastroenterology

## 2021-08-23 VITALS — BP 170/120 | HR 107 | Temp 97.5°F | Ht 65.0 in | Wt 188.2 lb

## 2021-08-23 DIAGNOSIS — K6289 Other specified diseases of anus and rectum: Secondary | ICD-10-CM

## 2021-08-23 DIAGNOSIS — K5900 Constipation, unspecified: Secondary | ICD-10-CM | POA: Insufficient documentation

## 2021-08-23 DIAGNOSIS — R1310 Dysphagia, unspecified: Secondary | ICD-10-CM

## 2021-08-23 DIAGNOSIS — I10 Essential (primary) hypertension: Secondary | ICD-10-CM

## 2021-08-23 DIAGNOSIS — R1084 Generalized abdominal pain: Secondary | ICD-10-CM

## 2021-08-23 MED ORDER — HYDROCORTISONE (PERIANAL) 2.5 % EX CREA: 1.0000 "application " | TOPICAL_CREAM | Freq: Two times a day (BID) | CUTANEOUS | 1 refills | Status: DC

## 2021-08-23 NOTE — Telephone Encounter (Signed)
Lets go ahead and arrange a follow-up for this patient in 3-4 months.

## 2021-08-23 NOTE — Patient Instructions (Addendum)
Please take your blood pressure medications as soon as you get home.   For trouble swallowing: We will arrange for you to have an upper endoscopy with possible dilation of your esophagus in the near future with Dr. Abbey Chatters. Be sure to take your blood pressure medications the morning of your procedure. Please be aware if your blood pressure is as high as it is today on the day of your procedure, anesthesia will cancel your procedure.  For constipation: Stop Amitiza Stop MiraLAX Start Linzess 290 mcg daily 30 minutes before your first meal.  This may cause diarrhea, but should taper off in a couple of weeks.  We are providing samples for you today. Please call with a progress report in 1-2 weeks.  If this medication is working well, I will send in a prescription for you.  Stop BC powders and avoid all NSAID products including ibuprofen, Aleve, Advil, naproxen, Goody powders, and anything that says "NSAID" on the package.  You may use Tylenol as needed for pain.  No more than 3000 mg of Tylenol per day.  We will plan to follow-up with you in about 3 months, but do not hesitate to call if you have questions or concerns prior to your next visit.    It was a pleasure meeting you today!  Aliene Altes, PA-C [facility] Gastroenterology

## 2021-08-23 NOTE — Telephone Encounter (Signed)
Called pt to schedule EGD +/-dil with propofol Dr. Abbey Chatters for 10/17 at 7:30am, ASA 3. Aware she will need pre-op appt prior.  [facility]21. Dr. Abbey Chatters still would not have anything to schedule prior as she was just seen today for her new patient OV. Manuela Schwartz please see if [facility] can update expiration for referral? Thanks!

## 2021-08-23 NOTE — Telephone Encounter (Signed)
I forgot to include instructions for rectal pain/possible hemorrhoids on patient's AVS.   Please let her know I would like her to start Anusol rectal cream twice daily. I will print a Rx and we can mail it to her.  Limit toilet time to 2-3 minutes.  Avoid straining.  She can try warm water soaks to help with rectal discomfort as well.

## 2021-08-23 NOTE — Telephone Encounter (Signed)
Phoned the pt and she was still in [place]. Given instructions, as well as printed them out, letter for the pt and her Rx to be picked up.

## 2021-08-24 ENCOUNTER — Encounter: Payer: Self-pay | Admitting: *Deleted

## 2021-09-11 NOTE — Patient Instructions (Signed)
Rhonda Gibbs  09/11/2021     @PREFPERIOPPHARMACY @   Your procedure is scheduled on  09/17/2021.   Report to [facility] at  [phone]  A.M.   Call this number if you have problems the morning of surgery:  [phone]   Remember:  Follow the diet instructions given to you by the office.    Take these medicines the morning of surgery with A SIP OF WATER      xanax(if needed), atenolol, indocin, omeprazole, rimegepant or imitrex(if needed), zoloft, zanaflex (if needed).     Do not wear jewelry, make-up or nail polish.  Do not wear lotions, powders, or perfumes, or deodorant.  Do not shave 48 hours prior to surgery.  Men may shave face and neck.  Do not bring valuables to the hospital.  [facility] is not responsible for any belongings or valuables.  Contacts, dentures or bridgework may not be worn into surgery.  Leave your suitcase in the car.  After surgery it may be brought to your room.  For patients admitted to the hospital, discharge time will be determined by your treatment team.  Patients discharged the day of surgery will not be allowed to drive home and must have someone with them for 24 hours.    Special instructions:   DO NOT smoke tobacco or vape for 24 hours before your procedure.  Please read over the following fact sheets that you were given. Anesthesia Post-op Instructions and Care and Recovery After Surgery      Upper Endoscopy, Adult, Care After This sheet gives you information about how to care for yourself after your procedure. Your health care provider may also give you more specific instructions. If you have problems or questions, contact your health care provider. What can I expect after the procedure? After the procedure, it is common to have: A sore throat. Mild stomach pain or discomfort. Bloating. Nausea. Follow these instructions at home:  Follow instructions from your health care provider about what to eat or drink after  your procedure. Return to your normal activities as told by your health care provider. Ask your health care provider what activities are safe for you. Take over-the-counter and prescription medicines only as told by your health care provider. If you were given a sedative during the procedure, it can affect you for several hours. Do not drive or operate machinery until your health care provider says that it is safe. Keep all follow-up visits as told by your health care provider. This is important. Contact a health care provider if you have: A sore throat that lasts longer than one day. Trouble swallowing. Get help right away if: You vomit blood or your vomit looks like coffee grounds. You have: A fever. Bloody, black, or tarry stools. A severe sore throat or you cannot swallow. Difficulty breathing. Severe pain in your chest or abdomen. Summary After the procedure, it is common to have a sore throat, mild stomach discomfort, bloating, and nausea. If you were given a sedative during the procedure, it can affect you for several hours. Do not drive or operate machinery until your health care provider says that it is safe. Follow instructions from your health care provider about what to eat or drink after your procedure. Return to your normal activities as told by your health care provider. This information is not intended to replace advice given to you by your health care provider. Make sure you discuss any questions  you have with your health care provider. Document Revised: 11/16/2019 Document Reviewed: 04/20/2018 Elsevier Patient Education  2022 [place]. Esophageal Dilatation Esophageal dilatation, also called esophageal dilation, is a procedure to widen or open a blocked or narrowed part of the esophagus. The esophagus is the part of the body that moves food and liquid from the mouth to the stomach. You may need this procedure if: You have a buildup of scar tissue in your esophagus  that makes it difficult, painful, or impossible to swallow. This can be caused by gastroesophageal reflux disease (GERD). You have cancer of the esophagus. There is a problem with how food moves through your esophagus. In some cases, you may need this procedure repeated at a later time to dilate the esophagus gradually. Tell a health care provider about: Any allergies you have. All medicines you are taking, including vitamins, herbs, eye drops, creams, and over-the-counter medicines. Any problems you or family members have had with anesthetic medicines. Any blood disorders you have. Any surgeries you have had. Any medical conditions you have. Any antibiotic medicines you are required to take before dental procedures. Whether you are pregnant or may be pregnant. What are the risks? Generally, this is a safe procedure. However, problems may occur, including: Bleeding due to a tear in the lining of the esophagus. A hole, or perforation, in the esophagus. What happens before the procedure? Ask your health care provider about: Changing or stopping your regular medicines. This is especially important if you are taking diabetes medicines or blood thinners. Taking medicines such as aspirin and ibuprofen. These medicines can thin your blood. Do not take these medicines unless your health care provider tells you to take them. Taking over-the-counter medicines, vitamins, herbs, and supplements. Follow instructions from your health care provider about eating or drinking restrictions. Plan to have a responsible adult take you home from the hospital or clinic. Plan to have a responsible adult care for you for the time you are told after you leave the hospital or clinic. This is important. What happens during the procedure? You may be given a medicine to help you relax (sedative). A numbing medicine may be sprayed into the back of your throat, or you may gargle the medicine. Your health care provider  may perform the dilatation using various surgical instruments, such as: Simple dilators. This instrument is carefully placed in the esophagus to stretch it. Guided wire bougies. This involves using an endoscope to insert a wire into the esophagus. A dilator is passed over this wire to enlarge the esophagus. Then the wire is removed. Balloon dilators. An endoscope with a small balloon is inserted into the esophagus. The balloon is inflated to stretch the esophagus and open it up. The procedure may vary among health care providers and hospitals. What can I expect after the procedure? Your blood pressure, heart rate, breathing rate, and blood oxygen level will be monitored until you leave the hospital or clinic. Your throat may feel slightly sore and numb. This will get better over time. You will not be allowed to eat or drink until your throat is no longer numb. When you are able to drink, urinate, and sit on the edge of the bed without nausea or dizziness, you may be able to return home. Follow these instructions at home: Take over-the-counter and prescription medicines only as told by your health care provider. If you were given a sedative during the procedure, it can affect you for several hours. Do not drive or  operate machinery until your health care provider says that it is safe. Plan to have a responsible adult care for you for the time you are told. This is important. Follow instructions from your health care provider about any eating or drinking restrictions. Do not use any products that contain nicotine or tobacco, such as cigarettes, e-cigarettes, and chewing tobacco. If you need help quitting, ask your health care provider. Keep all follow-up visits. This is important. Contact a health care provider if: You have a fever. You have pain that is not relieved by medicine. Get help right away if: You have chest pain. You have trouble breathing. You have trouble swallowing. You vomit  blood. You have black, tarry, or bloody stools. These symptoms may represent a serious problem that is an emergency. Do not wait to see if the symptoms will go away. Get medical help right away. Call your local emergency services (911 in the [place] Do not drive yourself to the hospital. Summary Esophageal dilatation, also called esophageal dilation, is a procedure to widen or open a blocked or narrowed part of the esophagus. Plan to have a responsible adult take you home from the hospital or clinic. For this procedure, a numbing medicine may be sprayed into the back of your throat, or you may gargle the medicine. Do not drive or operate machinery until your health care provider says that it is safe. This information is not intended to replace advice given to you by your health care provider. Make sure you discuss any questions you have with your health care provider. Document Revised: 04/05/2020 Document Reviewed: 04/05/2020 Elsevier Patient Education  [place] After This sheet gives you information about how to care for yourself after your procedure. Your health care provider may also give you more specific instructions. If you have problems or questions, contact your health care provider. What can I expect after the procedure? After the procedure, it is common to have: Tiredness. Forgetfulness about what happened after the procedure. Impaired judgment for important decisions. Nausea or vomiting. Some difficulty with balance. Follow these instructions at home: For the time period you were told by your health care provider:   Rest as needed. Do not participate in activities where you could fall or become injured. Do not drive or use machinery. Do not drink alcohol. Do not take sleeping pills or medicines that cause drowsiness. Do not make important decisions or sign legal documents. Do not take care of children on your own. Eating and  drinking Follow the diet that is recommended by your health care provider. Drink enough fluid to keep your urine pale yellow. If you vomit: Drink water, juice, or soup when you can drink without vomiting. Make sure you have little or no nausea before eating solid foods. General instructions Have a responsible adult stay with you for the time you are told. It is important to have someone help care for you until you are awake and alert. Take over-the-counter and prescription medicines only as told by your health care provider. If you have sleep apnea, surgery and certain medicines can increase your risk for breathing problems. Follow instructions from your health care provider about wearing your sleep device: Anytime you are sleeping, including during daytime naps. While taking prescription pain medicines, sleeping medicines, or medicines that make you drowsy. Avoid smoking. Keep all follow-up visits as told by your health care provider. This is important. Contact a health care provider if: You keep feeling nauseous or  you keep vomiting. You feel light-headed. You are still sleepy or having trouble with balance after 24 hours. You develop a rash. You have a fever. You have redness or swelling around the IV site. Get help right away if: You have trouble breathing. You have new-onset confusion at home. Summary For several hours after your procedure, you may feel tired. You may also be forgetful and have poor judgment. Have a responsible adult stay with you for the time you are told. It is important to have someone help care for you until you are awake and alert. Rest as told. Do not drive or operate machinery. Do not drink alcohol or take sleeping pills. Get help right away if you have trouble breathing, or if you suddenly become confused. This information is not intended to replace advice given to you by your health care provider. Make sure you discuss any questions you have with your  health care provider. Document Revised: 08/03/2020 Document Reviewed: 10/21/2019 Elsevier Patient Education  2022 [employer].

## 2021-09-12 ENCOUNTER — Encounter (HOSPITAL_COMMUNITY)
Admission: RE | Admit: 2021-09-12 | Discharge: 2021-09-12 | Disposition: A | Discharge status: Home / Self Care | Payer: No Typology Code available for payment source | Source: Ambulatory Visit | Attending: Internal Medicine | Admitting: Internal Medicine

## 2021-09-12 ENCOUNTER — Other Ambulatory Visit: Payer: Self-pay

## 2021-09-12 DIAGNOSIS — Z01812 Encounter for preprocedural laboratory examination: Secondary | ICD-10-CM | POA: Insufficient documentation

## 2021-09-12 LAB — CBC WITH DIFFERENTIAL/PLATELET
Abs Immature Granulocytes: 0.01 10*3/uL (ref 0.00–0.07)
Basophils Absolute: 0 10*3/uL (ref 0.0–0.1)
Basophils Relative: 0 %
Eosinophils Absolute: 0.1 10*3/uL (ref 0.0–0.5)
Eosinophils Relative: 1 %
HCT: 41.6 % (ref 36.0–46.0)
Hemoglobin: 13.5 g/dL (ref 12.0–15.0)
Immature Granulocytes: 0 %
Lymphocytes Relative: 45 %
Lymphs Abs: 2.9 10*3/uL (ref 0.7–4.0)
MCH: 31.4 pg (ref 26.0–34.0)
MCHC: 32.5 g/dL (ref 30.0–36.0)
MCV: 96.7 fL (ref 80.0–100.0)
Monocytes Absolute: 0.6 10*3/uL (ref 0.1–1.0)
Monocytes Relative: 9 %
Neutro Abs: 2.8 10*3/uL (ref 1.7–7.7)
Neutrophils Relative %: 45 %
Platelets: 360 10*3/uL (ref 150–400)
RBC: 4.3 MIL/uL (ref 3.87–5.11)
RDW: 13.6 % (ref 11.5–15.5)
WBC: 6.4 10*3/uL (ref 4.0–10.5)
nRBC: 0 % (ref 0.0–0.2)

## 2021-09-12 LAB — BASIC METABOLIC PANEL
Anion gap: 7 (ref 5–15)
BUN: 8 mg/dL (ref 6–20)
CO2: 29 mmol/L (ref 22–32)
Calcium: 9.2 mg/dL (ref 8.9–10.3)
Chloride: 103 mmol/L (ref 98–111)
Creatinine, Ser: 0.78 mg/dL (ref 0.44–1.00)
GFR, Estimated: >60 mL/min (ref >60)
Glucose, Bld: 87 mg/dL (ref 70–99)
Potassium: 3.6 mmol/L (ref 3.5–5.1)
Sodium: 139 mmol/L (ref 135–145)

## 2021-09-12 NOTE — Progress Notes (Signed)
To whom it may concern,  Rhonda Gibbs was here for pre admission testing 09/12/2021 at 8 am.    Her procedure Upper endoscopy 09/17/2021 at 6:15.  No driving after procedure for 24 hours on 09/17/2021

## 2021-09-13 ENCOUNTER — Encounter (HOSPITAL_COMMUNITY)
Admission: RE | Admit: 2021-09-13 | Discharge: 2021-09-13 | Disposition: A | Discharge status: Home / Self Care | Payer: Non-veteran care | Source: Ambulatory Visit | Attending: Internal Medicine | Admitting: Internal Medicine

## 2021-09-17 ENCOUNTER — Encounter (HOSPITAL_COMMUNITY)
Admission: RE | Disposition: A | Discharge status: Home / Self Care | Payer: Self-pay | Source: Ambulatory Visit | Attending: Internal Medicine

## 2021-09-17 ENCOUNTER — Ambulatory Visit (HOSPITAL_COMMUNITY): Payer: No Typology Code available for payment source | Admitting: Anesthesiology

## 2021-09-17 ENCOUNTER — Other Ambulatory Visit: Payer: Self-pay

## 2021-09-17 ENCOUNTER — Other Ambulatory Visit: Payer: Self-pay | Admitting: Gastroenterology

## 2021-09-17 ENCOUNTER — Ambulatory Visit (HOSPITAL_COMMUNITY)
Admission: RE | Admit: 2021-09-17 | Discharge: 2021-09-17 | Disposition: A | Discharge status: Home / Self Care | Payer: No Typology Code available for payment source | Source: Ambulatory Visit | Attending: Internal Medicine | Admitting: Internal Medicine

## 2021-09-17 ENCOUNTER — Encounter (HOSPITAL_COMMUNITY): Payer: Self-pay

## 2021-09-17 DIAGNOSIS — K2970 Gastritis, unspecified, without bleeding: Secondary | ICD-10-CM

## 2021-09-17 DIAGNOSIS — K5909 Other constipation: Secondary | ICD-10-CM | POA: Insufficient documentation

## 2021-09-17 DIAGNOSIS — M3119 Other thrombotic microangiopathy: Secondary | ICD-10-CM | POA: Diagnosis not present

## 2021-09-17 DIAGNOSIS — R1013 Epigastric pain: Secondary | ICD-10-CM | POA: Diagnosis present

## 2021-09-17 DIAGNOSIS — K222 Esophageal obstruction: Secondary | ICD-10-CM

## 2021-09-17 DIAGNOSIS — I10 Essential (primary) hypertension: Secondary | ICD-10-CM | POA: Insufficient documentation

## 2021-09-17 DIAGNOSIS — R1310 Dysphagia, unspecified: Secondary | ICD-10-CM | POA: Insufficient documentation

## 2021-09-17 DIAGNOSIS — Z79899 Other long term (current) drug therapy: Secondary | ICD-10-CM | POA: Diagnosis not present

## 2021-09-17 DIAGNOSIS — Z800 Family history of malignant neoplasm of digestive organs: Secondary | ICD-10-CM | POA: Insufficient documentation

## 2021-09-17 DIAGNOSIS — G43909 Migraine, unspecified, not intractable, without status migrainosus: Secondary | ICD-10-CM | POA: Diagnosis not present

## 2021-09-17 HISTORY — PX: BIOPSY: SHX5522

## 2021-09-17 HISTORY — PX: BALLOON DILATION: SHX5330

## 2021-09-17 HISTORY — PX: ESOPHAGOGASTRODUODENOSCOPY (EGD) WITH PROPOFOL: SHX5813

## 2021-09-17 SURGERY — ESOPHAGOGASTRODUODENOSCOPY (EGD) WITH PROPOFOL
Anesthesia: General

## 2021-09-17 MED ORDER — PROPOFOL 10 MG/ML IV BOLUS
INTRAVENOUS | Status: DC | PRN
  Administered 2021-09-17: 130 mg via INTRAVENOUS
  Administered 2021-09-17: 50 mg via INTRAVENOUS

## 2021-09-17 MED ORDER — LIDOCAINE HCL (CARDIAC) PF 50 MG/5ML IV SOSY
PREFILLED_SYRINGE | INTRAVENOUS | Status: DC | PRN
  Administered 2021-09-17: 50 mg via INTRAVENOUS

## 2021-09-17 MED ORDER — LACTATED RINGERS IV SOLN: INTRAVENOUS | Status: DC

## 2021-09-17 MED ORDER — OMEPRAZOLE 20 MG PO CPDR: 20.0000 mg | DELAYED_RELEASE_CAPSULE | Freq: Two times a day (BID) | ORAL | 5 refills | Status: DC

## 2021-09-17 MED ORDER — OMEPRAZOLE MAGNESIUM 20 MG PO TBEC: 20.0000 mg | DELAYED_RELEASE_TABLET | Freq: Two times a day (BID) | ORAL | 3 refills | Status: DC

## 2021-09-17 NOTE — Anesthesia Postprocedure Evaluation (Signed)
Anesthesia Post Note  Patient: Rhonda Gibbs  Procedure(s) Performed: ESOPHAGOGASTRODUODENOSCOPY (EGD) WITH PROPOFOL BALLOON DILATION BIOPSY  Patient location during evaluation: PACU Anesthesia Type: General Level of consciousness: awake and alert and oriented Pain management: pain level controlled Vital Signs Assessment: post-procedure vital signs reviewed and stable Respiratory status: spontaneous breathing, nonlabored ventilation and respiratory function stable Cardiovascular status: blood pressure returned to baseline and stable Postop Assessment: no apparent nausea or vomiting Anesthetic complications: no   No notable events documented.   Last Vitals:  Vitals:   09/17/21 0800 09/17/21 0807  BP:  107/71  Pulse: 66   Resp: 20   Temp: 36.5 C   SpO2: 97%     Last Pain:  Vitals:   09/17/21 0807  TempSrc:   PainSc: 0-No pain                  C 

## 2021-09-17 NOTE — Op Note (Signed)
[facility] Patient Name: Rhonda Gibbs Procedure Date: 09/17/2021 7:10 AM MRN: [id number] Date of Birth: 09/04/1971 Attending MD: Elon Alas. Abbey Chatters DO CSN: [id number] Age: 50 Admit Type: Outpatient Procedure:                Upper GI endoscopy Indications:              Epigastric abdominal pain, Dysphagia Providers:                Elon Alas. Abbey Chatters, DO, Caprice Kluver, [place]                            Risa Grill, Technician Referring MD:              Medicines:                See the Anesthesia note for documentation of the                            administered medications Complications:            No immediate complications. Estimated Blood Loss:     Estimated blood loss was minimal. Procedure:                Pre-Anesthesia Assessment:                           - The anesthesia plan was to use monitored                            anesthesia care (MAC).                           After obtaining informed consent, the endoscope was                            passed under direct vision. Throughout the                            procedure, the patient's blood pressure, pulse, and                            oxygen saturations were monitored continuously. The                            GIF-H190 ([id number]) scope was introduced through the                            mouth, and advanced to the second part of duodenum.                            The upper GI endoscopy was accomplished without                            difficulty. The patient tolerated the procedure                            well. Scope In:  7:49:11 AM Scope Out: 7:54:02 AM Total Procedure Duration: 0 hours 4 minutes 51 seconds  Findings:      The Z-line was regular and was found 37 cm from the incisors.      One benign-appearing, intrinsic mild stenosis was found in the distal       esophagus. The stenosis was traversed. A TTS dilator was passed through       the scope. Dilation with an 18-19-20 mm balloon  dilator was performed to       20 mm. The dilation site was examined and showed mild mucosal disruption       and moderate improvement in luminal narrowing.      Patchy mild inflammation characterized by erythema was found in the       entire examined stomach. Biopsies were taken with a cold forceps for       Helicobacter pylori testing.      The duodenal bulb, first portion of the duodenum and second portion of       the duodenum were normal. Impression:               - Z-line regular, 37 cm from the incisors.                           - Benign-appearing esophageal stenosis. Dilated.                           - Gastritis. Biopsied.                           - Normal duodenal bulb, first portion of the                            duodenum and second portion of the duodenum. Moderate Sedation:      Per Anesthesia Care Recommendation:           - Patient has a contact number available for                            emergencies. The signs and symptoms of potential                            delayed complications were discussed with the                            patient. Return to normal activities tomorrow.                            Written discharge instructions were provided to the                            patient.                           - Resume previous diet.                           - Continue present medications.                           -  Await pathology results.                           - Repeat upper endoscopy PRN for retreatment.                           - Return to GI clinic in 4 months.                           - Use Prilosec (omeprazole) 20 mg PO BID for 12                            weeks then decrease down to once daily. . Procedure Code(s):        --- Professional ---                           [id number], Esophagogastroduodenoscopy, flexible,                            transoral; with transendoscopic balloon dilation of                            esophagus (less than  30 mm diameter)                           43239, 59, Esophagogastroduodenoscopy, flexible,                            transoral; with biopsy, single or multiple Diagnosis Code(s):        --- Professional ---                           K22.2, Esophageal obstruction                           K29.70, Gastritis, unspecified, without bleeding                           R10.13, Epigastric pain                           R13.10, Dysphagia, unspecified CPT copyright 2019 American Medical Association. All rights reserved. The codes documented in this report are preliminary and upon coder review may  be revised to meet current compliance requirements. Elon Alas. Abbey Chatters, DO [place] , DO 09/17/2021 8:01:59 AM This report has been signed electronically. Number of Addenda: 0

## 2021-09-17 NOTE — Transfer of Care (Signed)
Immediate Anesthesia Transfer of Care Note  Patient: Rhonda Gibbs  Procedure(s) Performed: ESOPHAGOGASTRODUODENOSCOPY (EGD) WITH PROPOFOL BALLOON DILATION BIOPSY  Patient Location: Short Stay  Anesthesia Type:General  Level of Consciousness: sedated  Airway & Oxygen Therapy: Patient Spontanous Breathing  Post-op Assessment: Report given to RN and Post -op Vital signs reviewed and stable  Post vital signs: Reviewed and stable  Last Vitals:  Vitals Value Taken Time  BP    Temp    Pulse    Resp    SpO2      Last Pain:  Vitals:   09/17/21 0744  TempSrc:   PainSc: 5       Patients Stated Pain Goal: 8 (47/39/58 4417)  Complications: No notable events documented.

## 2021-09-17 NOTE — Discharge Instructions (Addendum)
EGD Discharge instructions Please read the instructions outlined below and refer to this sheet in the next few weeks. These discharge instructions provide you with general information on caring for yourself after you leave the hospital. Your doctor may also give you specific instructions. While your treatment has been planned according to the most current medical practices available, unavoidable complications occasionally occur. If you have any problems or questions after discharge, please call your doctor. ACTIVITY You may resume your regular activity but move at a slower pace for the next 24 hours.  Take frequent rest periods for the next 24 hours.  Walking will help expel (get rid of) the air and reduce the bloated feeling in your abdomen.  No driving for 24 hours (because of the anesthesia (medicine) used during the test).  You may shower.  Do not sign any important legal documents or operate any machinery for 24 hours (because of the anesthesia used during the test).  NUTRITION Drink plenty of fluids.  You may resume your normal diet.  Begin with a light meal and progress to your normal diet.  Avoid alcoholic beverages for 24 hours or as instructed by your caregiver.  MEDICATIONS You may resume your normal medications unless your caregiver tells you otherwise.  WHAT YOU CAN EXPECT TODAY You may experience abdominal discomfort such as a feeling of fullness or "gas" pains.  FOLLOW-UP Your doctor will discuss the results of your test with you.  SEEK IMMEDIATE MEDICAL ATTENTION IF ANY OF THE FOLLOWING OCCUR: Excessive nausea (feeling sick to your stomach) and/or vomiting.  Severe abdominal pain and distention (swelling).  Trouble swallowing.  Temperature over 101 F (37.8 C).  Rectal bleeding or vomiting of blood.    Your EGD revealed mild amount inflammation in your stomach.  I took biopsies of this to rule out infection with a bacteria called H. pylori.  Await pathology results, my  office will contact you. I am going to increase your omeprazole to 20 mg twice daily for the next 12 weeks, then you can decrease down to once daily. You did have a tightening of esophagus which I stretched today. Hopefully this improves your swallowing. Follow up with GI in 4 months.    I hope you have a great rest of your week!  Elon Alas. Abbey Chatters, D.O. Gastroenterology and Hepatology [facility]

## 2021-09-17 NOTE — Progress Notes (Signed)
Patient needed written prescription.  Husband did not want to wait on the Rx.  Husband agreed to come back either to the office or [facility] to pickup the written prescription.

## 2021-09-17 NOTE — Anesthesia Preprocedure Evaluation (Signed)
Anesthesia Evaluation  Patient identified by MRN, date of birth, ID band Patient awake    Reviewed: Allergy & Precautions, NPO status , Patient's Chart, lab work & pertinent test results, reviewed documented beta blocker date and time   History of Anesthesia Complications Negative for: history of anesthetic complications  Airway Mallampati: I  TM Distance: >3 FB Neck ROM: Full    Dental  (+) Dental Advisory Given Crowns :   Pulmonary sleep apnea and Continuous Positive Airway Pressure Ventilation ,    Pulmonary exam normal breath sounds clear to auscultation       Cardiovascular Exercise Tolerance: Good hypertension, Pt. on medications and Pt. on home beta blockers + Past MI  Normal cardiovascular exam Rhythm:Regular Rate:Normal     Neuro/Psych  Headaches, PSYCHIATRIC DISORDERS Anxiety Depression  Neuromuscular disease (left foot drop)    GI/Hepatic Neg liver ROS, GERD  Medicated and Controlled,  Endo/Other  negative endocrine ROS  Renal/GU negative Renal ROS     Musculoskeletal  (+) Arthritis , Osteoarthritis,    Abdominal   Peds  Hematology  (+) anemia ,   Anesthesia Other Findings   Reproductive/Obstetrics                            Anesthesia Physical Anesthesia Plan  ASA: 3  Anesthesia Plan: General   Post-op Pain Management:    Induction: Intravenous  PONV Risk Score and Plan: TIVA  Airway Management Planned: Nasal Cannula and Natural Airway  Additional Equipment:   Intra-op Plan:   Post-operative Plan:   Informed Consent: I have reviewed the patients History and Physical, chart, labs and discussed the procedure including the risks, benefits and alternatives for the proposed anesthesia with the patient or authorized representative who has indicated his/her understanding and acceptance.     Dental advisory given  Plan Discussed with: CRNA and Surgeon  Anesthesia  Plan Comments:        Anesthesia Quick Evaluation

## 2021-09-17 NOTE — Interval H&P Note (Signed)
History and Physical Interval Note:  09/17/2021 7:43 AM  Rhonda Gibbs  has presented today for surgery, with the diagnosis of dysphagia.  The various methods of treatment have been discussed with the patient and family. After consideration of risks, benefits and other options for treatment, the patient has consented to  Procedure(s) with comments: ESOPHAGOGASTRODUODENOSCOPY (EGD) WITH PROPOFOL (N/A) - 7:30am BALLOON DILATION (N/A) as a surgical intervention.  The patient's history has been reviewed, patient examined, no change in status, stable for surgery.  I have reviewed the patient's chart and labs.  Questions were answered to the patient's satisfaction.     Eloise Harman

## 2021-09-18 LAB — SURGICAL PATHOLOGY

## 2021-09-19 ENCOUNTER — Encounter (HOSPITAL_COMMUNITY): Payer: Self-pay | Admitting: Internal Medicine

## 2021-10-03 ENCOUNTER — Telehealth: Payer: Self-pay | Admitting: *Deleted

## 2021-10-03 ENCOUNTER — Encounter: Payer: Self-pay | Admitting: *Deleted

## 2021-10-03 NOTE — Telephone Encounter (Signed)
Spoke to pt. She stated that she would like to try Pantoprazole because it is small then Prilosec and she is still having a hard time swallowing. Also stated she needs a paper prescription because she has to take it to the [place].

## 2021-11-15 ENCOUNTER — Encounter: Payer: Self-pay | Admitting: Gastroenterology

## 2022-01-14 ENCOUNTER — Ambulatory Visit: Payer: Non-veteran care | Admitting: Gastroenterology

## 2022-02-28 ENCOUNTER — Telehealth (INDEPENDENT_AMBULATORY_CARE_PROVIDER_SITE_OTHER): Payer: Self-pay | Admitting: Primary Care

## 2022-02-28 NOTE — Telephone Encounter (Signed)
Copied from [place] [id number]. Topic: General - Call Back - No Documentation ?>> Feb 28, 2022 10:16 AM Erick Blinks wrote: ?Best contact: [phone] ?Markus Daft called from the [place] to confirm if office received a fax that was submitted yesterday. Please advise ?

## 2022-02-28 NOTE — Telephone Encounter (Signed)
Left message asking Markus Daft to return call. Provided my direct ext [phone].  ?

## 2022-03-01 NOTE — Telephone Encounter (Signed)
Left message asking Rhonda Gibbs to return call to [phone]. ?

## 2022-03-16 NOTE — Progress Notes (Deleted)
? ? ?Referring Provider: [place] Physician:  [place] ?Primary GI Physician: Dr. Abbey Chatters ? ?No chief complaint on file. ? ? ?HPI:   ?Rhonda Gibbs is a 51 y.o. female with history of occasional reflux, dysphagia, constipation, rectal pain, and chronic abdominal pain.  She is presenting today for follow-up. ? ?Last seen in our office 08/23/2021 for abdominal pain, constipation, dysphagia, rectal pain.  She reported sharp/crampy abdominal pain that starts around the umbilicus, then becomes generalized for the last 4 to 5 years occurring 3-4 times a day lasting 10 to 15 minutes or less.  Not affected by meals or bowel movements.  Associated nausea without vomiting.  Occasionally waking at night with pain.  She had an umbilical hernia repair in 2015 at the [place].  She saw a Psychologist, sport and exercise in 2016 and was told her pain was not from her hernia repair.  Prior CT A/P without IV contrast May 2021 with moderate fecal retention, 2 cm right adnexal cyst, normal-appearing gallbladder, pancreas, liver, bowel wall.  Reported rare reflux symptoms with eating fried foods.  Also with occasional solid food dysphagia that was fairly infrequent.  She was taking BC powder for migraines about twice a week.  Also with constipation going up to 2 weeks without a bowel movement.  No improvement with Amitiza 24 mcg daily and MiraLAX daily.  Sharp intermittent rectal pain.  Single episode of toilet tissue hematochezia 2 months prior.  Family history significant for brother with colon cancer at age 56 and maternal grandmother with stomach cancer.  On exam, she had mild TTP in the epigastric area.  Recommended EGD with possible dilation, stop BC powders and avoid NSAIDs, stop Amitiza and MiraLAX and start Linzess 290 mcg daily, Anusol rectal cream twice daily, follow-up in 3 months. ? ?EGD 09/17/2021: Benign-appearing esophageal stenosis dilated, gastritis biopsied, normal examined duodenum.  Pathology with slight  chronic inflammation, negative for H. pylori.  Recommended Prilosec 20 mg twice daily x12 weeks, then once daily. ? ?Today: ? ?Abdominal pain: ? ?Constipation: ? ?Rectal pain: ? ?GERD: ? ?Dysphagia: ? ? ?Colonoscopy May 2021 with multiple adenomatous colon polyps, due for repeat in 2024.  ? ?Past Medical History:  ?Diagnosis Date  ? Anemia   ? Anxiety   ? Arthritis   ? Cataract   ? Depression   ? High cholesterol   ? Hypertension   ? Migraine   ? Myocardial infarction [facility])   ? years ago  ? Neuromuscular disorder ([place])   ? 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 OSA (obstructive sleep apnea)   ? states she has never used CPAP. "borderline"  ? Osteoporosis   ? Sleep apnea   ? has a cpap but does not use  ? Thyroid disease   ? ? ?Past Surgical History:  ?Procedure Laterality Date  ? ABDOMINAL HYSTERECTOMY    ? partial  ? BALLOON DILATION N/A 09/17/2021  ? Procedure: BALLOON DILATION;  Surgeon: Eloise Harman, DO;  [facility];  Service: Endoscopy;  Laterality: N/A;  ? BIOPSY  09/17/2021  ? Procedure: BIOPSY;  Surgeon: Eloise Harman, DO;  [facility];  Service: Endoscopy;;  ? CHONDROPLASTY Left 08/03/2015  ? Procedure: CHONDROPLASTY;  Surgeon: [name], MD;  Location: [place];  Service: Orthopedics;  Laterality: Left;  ? CLOSED MANIPULATION KNEE WITH STERIOD INJECTION Left 02/06/2017  ? Procedure: Closed MANIPULATION of knee with  Steroid Injection;  Surgeon: [name], MD;  Location: [place];  Service: Orthopedics;  Laterality: Left;  ? COLONOSCOPY  04/04/2020  ? Dr. Bryan Lemma; 5 tubular adenomas in the sigmoid and ascending colon, multiple hyperplastic polyp in the rectum, diverticulosis in the sigmoid colon, nonbleeding internal hemorrhoids.  Repeat in 3 years.  ? ESOPHAGOGASTRODUODENOSCOPY (EGD) WITH PROPOFOL N/A 09/17/2021  ? Procedure: ESOPHAGOGASTRODUODENOSCOPY (EGD) WITH PROPOFOL;  Surgeon: Eloise Harman, DO;  [facility];  Service: Endoscopy;  Laterality: N/A;  7:30am  ? HERNIA REPAIR    ? umbilical  ? JOINT REPLACEMENT Left 06/26/2016  ? Dr Percell Miller  ? KNEE ARTHROSCOPY WITH LATERAL RELEASE Left 08/03/2015  ? Procedure: LEFT KNEE ARTHROSCOPY  CHONDROPLASTY WITH LATERAL RELEASE;  Surgeon: [name], MD;  Location: [place];  Service: Orthopedics;  Laterality: Left;  ? TOTAL KNEE ARTHROPLASTY Left 06/26/2016  ? Procedure: TOTAL KNEE ARTHROPLASTY;  Surgeon: [name], MD;  Location: [place];  Service: Orthopedics;  Laterality: Left;  ? ? ?Current Outpatient Medications  ?Medication Sig Dispense Refill  ? alprazolam (XANAX) 2 MG tablet Take 2 mg by mouth 3 times/day as needed-between meals & bedtime for sleep. 1 tab PO 1 hour before EMG.    ? atenolol (TENORMIN) 25 MG tablet Take 25 mg by mouth daily.    ? carboxymethylcellulose (REFRESH PLUS) 0.5 % SOLN Place 1 drop into both eyes 3 (three) times daily as needed (dry eye).    ? chlorthalidone (HYGROTON) 50 MG tablet Take 50 mg by mouth daily.    ? clobetasol ointment (TEMOVATE) 0.05 % Apply thin film to external vulvar area nightly for 1 month, then switch to every other night for 2 weeks, then decrease use to 2 nights per week only. Wash hands after use 60 g 1  ? fluticasone (FLONASE) 50 MCG/ACT nasal spray Place 2 sprays into both nostrils daily as needed for rhinitis.    ? hydrocortisone (ANUSOL-HC) 2.5 % rectal cream Place 1 application rectally 2 (two) times daily. 30 g 1  ? indomethacin (INDOCIN) 50 MG capsule Take 50 mg by mouth daily as needed (gout).    ? lidocaine (LIDODERM) 5 % Place 1 patch onto the skin daily as needed. Remove & Discard patch within 12 hours or as directed by MD    ? MENTHOL-METHYL SALICYLATE EX Apply topically as needed. Apply small amount to affected area four times daily.    ? Multiple Vitamin (MULTIVITAMIN ADULT PO) Take by mouth as needed.    ? omeprazole (PRILOSEC OTC) 20 MG tablet Take 1  tablet (20 mg total) by mouth 2 (two) times daily before a meal. 60 tablet 3  ? omeprazole (PRILOSEC) 20 MG capsule Take 20 mg by mouth daily as needed. Take 1 capsule by mouth once daily in the am..    ? polyethylene glycol (MIRALAX / GLYCOLAX) 17 g packet Take 17 g by mouth daily as needed.    ? PRAZOSIN HCL PO Take 2 mg by mouth.    ? psyllium (METAMUCIL) 58.6 % packet Take 1 packet by mouth daily as needed.    ? Rimegepant Sulfate 75 MG TBDP Take 1 tablet by mouth as needed.    ? sertraline (ZOLOFT) 100 MG tablet Take 500 mg by mouth daily as needed.    ? spironolactone (ALDACTONE) 25 MG tablet Take 25 mg by mouth daily.    ? SUMAtriptan (IMITREX) 100 MG tablet Take 100 mg by mouth  every 2 (two) hours as needed for migraine. First dose at onset of headache, then repeat in 2 hours PRN.    ? tiZANidine (ZANAFLEX) 4 MG capsule Take 4 mg by mouth 3 (three) times daily as needed.    ? traZODone (DESYREL) 50 MG tablet Take 50 mg by mouth at bedtime as needed.    ? ?No current facility-administered medications for this visit.  ? ? ?Allergies as of 03/18/2022 - Review Complete 09/17/2021  ?Allergen Reaction Noted  ? No known allergies  06/25/2016  ? Metoprolol Rash 03/01/2010  ? ? ?Family History  ?Problem Relation Age of Onset  ? Hypertension Mother   ? Thyroid disease Mother   ?     Had surgery, unknown diagnosis  ? Diabetes Father   ? Hypertension Father   ? Heart disease Father   ?     chf  ? Thyroid disease Sister   ? Hypertension Brother   ? Stomach cancer Brother   ? Gastric cancer Brother   ? Colon cancer Maternal Grandmother   ? Stomach cancer Maternal Grandmother   ? Gastric cancer Maternal Grandmother   ? Cancer Maternal Grandfather   ?     colon  ? Cancer Paternal Grandmother   ?     cervical or colon????  ? Cancer Cousin   ?     prostate  ? Thyroid cancer Neg Hx   ? Colon polyps Neg Hx   ? Esophageal cancer Neg Hx   ? ? ?Social History  ? ?Socioeconomic History  ? Marital status: Married  ?  Spouse name:  Not on file  ? Number of children: Not on file  ? Years of education: Not on file  ? Highest education level: Not on file  ?Occupational History  ? Not on file  ?Tobacco Use  ? Smoking status: Never  ? Smokeless

## 2022-03-18 ENCOUNTER — Ambulatory Visit: Payer: Non-veteran care | Admitting: Gastroenterology

## 2022-03-18 ENCOUNTER — Encounter: Payer: Self-pay | Admitting: Internal Medicine

## 2022-04-18 ENCOUNTER — Ambulatory Visit: Payer: Non-veteran care | Admitting: Internal Medicine

## 2022-05-15 ENCOUNTER — Encounter: Payer: Self-pay | Admitting: Internal Medicine

## 2022-05-15 ENCOUNTER — Ambulatory Visit (INDEPENDENT_AMBULATORY_CARE_PROVIDER_SITE_OTHER): Payer: Non-veteran care | Admitting: Internal Medicine

## 2022-05-15 VITALS — BP 162/110 | HR 87 | Temp 98.4°F | Ht 64.0 in | Wt 182.2 lb

## 2022-05-15 DIAGNOSIS — K219 Gastro-esophageal reflux disease without esophagitis: Secondary | ICD-10-CM | POA: Diagnosis not present

## 2022-05-15 DIAGNOSIS — R140 Abdominal distension (gaseous): Secondary | ICD-10-CM

## 2022-05-15 DIAGNOSIS — K581 Irritable bowel syndrome with constipation: Secondary | ICD-10-CM | POA: Diagnosis not present

## 2022-05-15 MED ORDER — OMEPRAZOLE MAGNESIUM 20 MG PO TBEC: 20.0000 mg | DELAYED_RELEASE_TABLET | Freq: Two times a day (BID) | ORAL | 3 refills | Status: DC

## 2022-05-15 NOTE — Patient Instructions (Signed)
For your chronic acid reflux, continue on omeprazole.  I have sent in a year supply of refills today.  For your chronic constipation, we will give you samples of Linzess 290 mcg daily.  You may have an initial washout period with this medication though this should improve after 2 to 3 days.  Let us know how you are doing next week and I will send in formal prescription.  We can decrease dose if needed.  Otherwise follow-up in 6 months or sooner if needed.  It was very nice seeing you again today.  Dr. Abbey Chatters

## 2022-05-15 NOTE — Progress Notes (Signed)
Referring Provider: [place] [facility] Physician:  [place] [facility]:  Dr. Abbey Chatters  Chief Complaint  Patient presents with   Follow-up    Patient here today for a follow up from 09/17/2021 EGD. Patient states her dysphagia is better since the dilation. She does have some occasional lower abdominal pains.     HPI:   Rhonda Gibbs is a 51 y.o. female who presents to clinic today for follow-up visit.  History of chronic GERD well-controlled on omeprazole.  Requesting refills today.  History of esophageal dysphagia.  Underwent EGD 09/17/2021 which showed esophageal stricture status post dilation with 20 mm balloon.  States her swallowing is much improved.  Does have chronic constipation.  States she takes MiraLAX 3 times daily with little improvement.  Averaging 1-2 bowel movements a week.  Does have some straining.  Also with associated abdominal bloating.  Improved after bowel movements.  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 "borderline"   Osteoporosis    Sleep apnea    has a cpap but does not use   Thyroid disease     Past Surgical History:  Procedure Laterality Date   ABDOMINAL HYSTERECTOMY     partial   BALLOON DILATION N/A 09/17/2021   Procedure: BALLOON DILATION;  Surgeon: Eloise Harman, DO;  [facility];  Service: Endoscopy;  Laterality: N/A;   BIOPSY  09/17/2021   Procedure: BIOPSY;  Surgeon: Eloise Harman, DO;  [facility];  Service: Endoscopy;;   CHONDROPLASTY Left 08/03/2015   Procedure: CHONDROPLASTY;  Surgeon: [name], MD;  Location: [place];  Service:  Orthopedics;  Laterality: Left;   CLOSED MANIPULATION KNEE WITH STERIOD INJECTION Left 02/06/2017   Procedure: Closed MANIPULATION of knee with Steroid Injection;  Surgeon: [name], MD;  Location: [place];  Service: Orthopedics;  Laterality: Left;   COLONOSCOPY  04/04/2020   Dr. Bryan Lemma; 5 tubular adenomas in the sigmoid and ascending colon, multiple hyperplastic polyp in the rectum, diverticulosis in the sigmoid colon, nonbleeding internal hemorrhoids.  Repeat in 3 years.   ESOPHAGOGASTRODUODENOSCOPY (EGD) WITH PROPOFOL N/A 09/17/2021   Procedure: ESOPHAGOGASTRODUODENOSCOPY (EGD) WITH PROPOFOL;  Surgeon: Eloise Harman, DO;  [facility];  Service: Endoscopy;  Laterality: N/A;  7:30am   HERNIA REPAIR     umbilical   JOINT REPLACEMENT Left 06/26/2016   Dr Percell Miller   KNEE ARTHROSCOPY WITH LATERAL RELEASE Left 08/03/2015   Procedure: LEFT KNEE ARTHROSCOPY  CHONDROPLASTY WITH LATERAL RELEASE;  Surgeon: [name], MD;  Location: [place];  Service: Orthopedics;  Laterality: Left;   TOTAL KNEE ARTHROPLASTY Left 06/26/2016   Procedure: TOTAL KNEE ARTHROPLASTY;  Surgeon: [name], MD;  Location: [place];  Service: Orthopedics;  Laterality: Left;    Current Outpatient Medications  Medication Sig Dispense Refill   alprazolam (XANAX) 2 MG tablet Take 2 mg by mouth 3 times/day as needed-between meals & bedtime for sleep. 1 tab PO 1 hour  before EMG.     atenolol (TENORMIN) 25 MG tablet Take 25 mg by mouth daily.     carboxymethylcellulose (REFRESH PLUS) 0.5 % SOLN Place 1 drop into both eyes 3 (three) times daily as needed (dry eye).     chlorthalidone (HYGROTON) 50 MG tablet Take 50 mg by mouth daily.     clobetasol ointment (TEMOVATE) 0.05 % Apply thin film to external vulvar area nightly for 1 month, then switch to every other night for 2 weeks, then decrease use to 2 nights per week only. Wash hands after use 60 g 1   fluticasone  (FLONASE) 50 MCG/ACT nasal spray Place 2 sprays into both nostrils daily as needed for rhinitis.     indomethacin (INDOCIN) 50 MG capsule Take 50 mg by mouth daily as needed (gout).     lidocaine (LIDODERM) 5 % Place 1 patch onto the skin daily as needed. Remove & Discard patch within 12 hours or as directed by MD     MENTHOL-METHYL SALICYLATE EX Apply topically as needed. Apply small amount to affected area four times daily.     Multiple Vitamin (MULTIVITAMIN ADULT PO) Take by mouth as needed.     polyethylene glycol (MIRALAX / GLYCOLAX) 17 g packet Take 17 g by mouth daily as needed.     PRAZOSIN HCL PO Take 2 mg by mouth daily at 6 (six) AM.     psyllium (METAMUCIL) 58.6 % packet Take 1 packet by mouth daily as needed.     Rimegepant Sulfate 75 MG TBDP Take 1 tablet by mouth as needed.     sertraline (ZOLOFT) 100 MG tablet Take 500 mg by mouth daily as needed.     spironolactone (ALDACTONE) 25 MG tablet Take 25 mg by mouth daily.     SUMAtriptan (IMITREX) 100 MG tablet Take 100 mg by mouth every 2 (two) hours as needed for migraine. First dose at onset of headache, then repeat in 2 hours PRN.     tiZANidine (ZANAFLEX) 4 MG capsule Take 4 mg by mouth 3 (three) times daily as needed.     traZODone (DESYREL) 50 MG tablet Take 50 mg by mouth at bedtime as needed.     omeprazole (PRILOSEC OTC) 20 MG tablet Take 1 tablet (20 mg total) by mouth 2 (two) times daily before a meal. 180 tablet 3   No current facility-administered medications for this visit.    Allergies as of 05/15/2022 - Review Complete 05/15/2022  Allergen Reaction Noted   No known allergies  06/25/2016   Metoprolol Rash 03/01/2010    Family History  Problem Relation Age of Onset   Hypertension Mother    Thyroid disease Mother        Had surgery, unknown diagnosis   Diabetes Father    Hypertension Father    Heart disease Father        chf   Thyroid disease Sister    Hypertension Brother    Stomach cancer Brother     Gastric cancer Brother    Colon cancer Maternal Grandmother    Stomach cancer Maternal Grandmother    Gastric cancer Maternal Grandmother    Cancer Maternal Grandfather        colon   Cancer Paternal Grandmother        cervical or colon????   Cancer Cousin        prostate   Thyroid cancer Neg Hx    Colon polyps Neg Hx    Esophageal cancer Neg Hx  Social History   Socioeconomic History   Marital status: Married    Spouse name: Not on file   Number of children: Not on file   Years of education: Not on file   Highest education level: Not on file  Occupational History   Not on file  Tobacco Use   Smoking status: Never   Smokeless tobacco: Never  Vaping Use   Vaping Use: Never used  Substance and Sexual Activity   Alcohol use: Not Currently   Drug use: No   Sexual activity: Yes    Birth control/protection: Surgical  Other Topics Concern   Not on file  Social History Narrative   Not on file   Social Determinants of Health   Financial Resource Strain: Not on file  Food Insecurity: Not on file  Transportation Needs: Not on file  Physical Activity: Not on file  Stress: Not on file  Social Connections: Not on file    Subjective: Review of Systems  Constitutional:  Negative for chills and fever.  HENT:  Negative for congestion and hearing loss.   Eyes:  Negative for blurred vision and double vision.  Respiratory:  Negative for cough and shortness of breath.   Cardiovascular:  Negative for chest pain and palpitations.  Gastrointestinal:  Positive for constipation and heartburn. Negative for abdominal pain, blood in stool, diarrhea, melena and vomiting.       Bloating  Genitourinary:  Negative for dysuria and urgency.  Musculoskeletal:  Negative for joint pain and myalgias.  Skin:  Negative for itching and rash.  Neurological:  Negative for dizziness and headaches.  Psychiatric/Behavioral:  Negative for depression. The patient is not nervous/anxious.       Objective: BP (!) 165/102 (BP Location: Left Arm, Patient Position: Sitting, Cuff Size: Large)   Pulse 87   Temp 98.4 F (36.9 C) (Oral)   Ht '5\' 4"'$  (1.626 m)   Wt 182 lb 3.2 oz (82.6 kg)   BMI 31.27 kg/m  Physical Exam Constitutional:      Appearance: Normal appearance.  HENT:     Head: Normocephalic and atraumatic.  Eyes:     Extraocular Movements: Extraocular movements intact.     Conjunctiva/sclera: Conjunctivae normal.  Cardiovascular:     Rate and Rhythm: Normal rate and regular rhythm.  Pulmonary:     Effort: Pulmonary effort is normal.     Breath sounds: Normal breath sounds.  Abdominal:     General: Bowel sounds are normal.     Palpations: Abdomen is soft.  Musculoskeletal:        General: No swelling. Normal range of motion.     Cervical back: Normal range of motion and neck supple.  Skin:    General: Skin is warm and dry.     Coloration: Skin is not jaundiced.  Neurological:     General: No focal deficit present.     Mental Status: She is alert and oriented to person, place, and time.  Psychiatric:        Mood and Affect: Mood normal.        Behavior: Behavior normal.      Assessment: *GERD well-controlled on omeprazole *Dysphagia-resolved *Chronic constipation *Abdominal bloating  Plan: GERD well-controlled on omeprazole.  Refilled today.    Dysphagia resolved status post esophageal dilation for stricture previously.  Continue to monitor.  For patient's abdominal bloating chronic constipation, likely irritable bowel syndrome-constipation predominant.  On MiraLAX 3 times daily with little relief.  Averaging 1-2 bowel movements a week.  We will give samples of Linzess 290 mcg daily.  Patient to call with update next week and I will send in formal prescription if helping.  Otherwise follow-up in 6 months.  05/15/2022 11:15 AM   Disclaimer: This note was dictated with voice recognition software. Similar sounding words can inadvertently be  transcribed and may not be corrected upon review.

## 2022-05-21 ENCOUNTER — Encounter (INDEPENDENT_AMBULATORY_CARE_PROVIDER_SITE_OTHER): Payer: Self-pay | Admitting: Primary Care

## 2022-05-21 ENCOUNTER — Ambulatory Visit (INDEPENDENT_AMBULATORY_CARE_PROVIDER_SITE_OTHER): Payer: No Typology Code available for payment source | Admitting: Primary Care

## 2022-05-21 VITALS — BP 132/92 | HR 78 | Temp 97.9°F | Ht 64.0 in | Wt 177.4 lb

## 2022-05-21 DIAGNOSIS — I10 Essential (primary) hypertension: Secondary | ICD-10-CM

## 2022-05-21 DIAGNOSIS — M21372 Foot drop, left foot: Secondary | ICD-10-CM | POA: Diagnosis not present

## 2022-05-21 DIAGNOSIS — Z760 Encounter for issue of repeat prescription: Secondary | ICD-10-CM

## 2022-05-21 DIAGNOSIS — Z6830 Body mass index (BMI) 30.0-30.9, adult: Secondary | ICD-10-CM

## 2022-05-21 DIAGNOSIS — Z7689 Persons encountering health services in other specified circumstances: Secondary | ICD-10-CM

## 2022-05-21 MED ORDER — CHLORTHALIDONE 50 MG PO TABS: 50.0000 mg | ORAL_TABLET | Freq: Every day | ORAL | 1 refills | Status: AC

## 2022-05-21 MED ORDER — ATENOLOL 25 MG PO TABS: 25.0000 mg | ORAL_TABLET | Freq: Every day | ORAL | 1 refills | Status: AC

## 2022-05-21 MED ORDER — DILTIAZEM HCL ER COATED BEADS 120 MG PO CP24: 120.0000 mg | ORAL_CAPSULE | Freq: Every day | ORAL | 0 refills | Status: DC

## 2022-05-21 MED ORDER — LIDOCAINE 5 % EX PTCH: 1.0000 | MEDICATED_PATCH | Freq: Every day | CUTANEOUS | 6 refills | Status: DC | PRN

## 2022-05-21 NOTE — Progress Notes (Signed)
New Patient Office Visit  Subjective    Patient ID: Rhonda Gibbs, female    DOB: 1971/05/02  Age: 51 y.o. MRN: [id number]  CC:  Chief Complaint  Patient presents with   New Patient (Initial Visit)    HPI Rhonda Gibbs presents to establish care. Her main concern is HTN management- She has chronic migraines - manage by neurology .Denies shortness of breath, chest pain or lower extremity edema. Chronic left knee pain from knee down- drop foot.    Outpatient Encounter Medications as of 05/21/2022  Medication Sig   alprazolam (XANAX) 2 MG tablet Take 2 mg by mouth 3 times/day as needed-between meals & bedtime for sleep. 1 tab PO 1 hour before EMG.   atenolol (TENORMIN) 25 MG tablet Take 25 mg by mouth daily.   carboxymethylcellulose (REFRESH PLUS) 0.5 % SOLN Place 1 drop into both eyes 3 (three) times daily as needed (dry eye).   chlorthalidone (HYGROTON) 50 MG tablet Take 50 mg by mouth daily.   clobetasol ointment (TEMOVATE) 0.05 % Apply thin film to external vulvar area nightly for 1 month, then switch to every other night for 2 weeks, then decrease use to 2 nights per week only. Wash hands after use   fluticasone (FLONASE) 50 MCG/ACT nasal spray Place 2 sprays into both nostrils daily as needed for rhinitis.   indomethacin (INDOCIN) 50 MG capsule Take 50 mg by mouth daily as needed (gout).   lidocaine (LIDODERM) 5 % Place 1 patch onto the skin daily as needed. Remove & Discard patch within 12 hours or as directed by MD   MENTHOL-METHYL SALICYLATE EX Apply topically as needed. Apply small amount to affected area four times daily.   Multiple Vitamin (MULTIVITAMIN ADULT PO) Take by mouth as needed.   omeprazole (PRILOSEC OTC) 20 MG tablet Take 1 tablet (20 mg total) by mouth 2 (two) times daily before a meal.   polyethylene glycol (MIRALAX / GLYCOLAX) 17 g packet Take 17 g by mouth daily as needed.   PRAZOSIN HCL PO Take 2 mg by mouth daily at 6 (six) AM.   psyllium (METAMUCIL)  58.6 % packet Take 1 packet by mouth daily as needed.   Rimegepant Sulfate 75 MG TBDP Take 1 tablet by mouth as needed.   sertraline (ZOLOFT) 100 MG tablet Take 500 mg by mouth daily as needed.   spironolactone (ALDACTONE) 25 MG tablet Take 25 mg by mouth daily.   SUMAtriptan (IMITREX) 100 MG tablet Take 100 mg by mouth every 2 (two) hours as needed for migraine. First dose at onset of headache, then repeat in 2 hours PRN.   tiZANidine (ZANAFLEX) 4 MG capsule Take 4 mg by mouth 3 (three) times daily as needed.   traZODone (DESYREL) 50 MG tablet Take 50 mg by mouth at bedtime as needed.   No facility-administered encounter medications on file as of 05/21/2022.    Past Medical History:  Diagnosis Date   Anemia    Anxiety    Arthritis    Cataract    Depression    High cholesterol    Hypertension    Migraine    Myocardial infarction [facility])    years ago   Neuromuscular disorder (HCC)    drop foot on left, numbness in left foot and left side of knee   OSA (obstructive sleep apnea)    states she has never used CPAP. "borderline"   Osteoporosis    Sleep apnea    has a cpap  but does not use   Thyroid disease     Past Surgical History:  Procedure Laterality Date   ABDOMINAL HYSTERECTOMY     partial   BALLOON DILATION N/A 09/17/2021   Procedure: BALLOON DILATION;  Surgeon: Eloise Harman, DO;  [facility];  Service: Endoscopy;  Laterality: N/A;   BIOPSY  09/17/2021   Procedure: BIOPSY;  Surgeon: Eloise Harman, DO;  [facility];  Service: Endoscopy;;   CHONDROPLASTY Left 08/03/2015   Procedure: CHONDROPLASTY;  Surgeon: [name], MD;  Location: [place];  Service: Orthopedics;  Laterality: Left;   CLOSED MANIPULATION KNEE WITH STERIOD INJECTION Left 02/06/2017   Procedure: Closed MANIPULATION of knee with Steroid Injection;  Surgeon: [name], MD;  Location: [place];  Service: Orthopedics;  Laterality: Left;    COLONOSCOPY  04/04/2020   Dr. Bryan Lemma; 5 tubular adenomas in the sigmoid and ascending colon, multiple hyperplastic polyp in the rectum, diverticulosis in the sigmoid colon, nonbleeding internal hemorrhoids.  Repeat in 3 years.   ESOPHAGOGASTRODUODENOSCOPY (EGD) WITH PROPOFOL N/A 09/17/2021   Procedure: ESOPHAGOGASTRODUODENOSCOPY (EGD) WITH PROPOFOL;  Surgeon: Eloise Harman, DO;  [facility];  Service: Endoscopy;  Laterality: N/A;  7:30am   HERNIA REPAIR     umbilical   JOINT REPLACEMENT Left 06/26/2016   Dr Percell Miller   KNEE ARTHROSCOPY WITH LATERAL RELEASE Left 08/03/2015   Procedure: LEFT KNEE ARTHROSCOPY  CHONDROPLASTY WITH LATERAL RELEASE;  Surgeon: [name], MD;  Location: [place];  Service: Orthopedics;  Laterality: Left;   TOTAL KNEE ARTHROPLASTY Left 06/26/2016   Procedure: TOTAL KNEE ARTHROPLASTY;  Surgeon: [name], MD;  Location: [place];  Service: Orthopedics;  Laterality: Left;    Family History  Problem Relation Age of Onset   Hypertension Mother    Thyroid disease Mother        Had surgery, unknown diagnosis   Diabetes Father    Hypertension Father    Heart disease Father        chf   Thyroid disease Sister    Hypertension Brother    Stomach cancer Brother    Gastric cancer Brother    Colon cancer Maternal Grandmother    Stomach cancer Maternal Grandmother    Gastric cancer Maternal Grandmother    Cancer Maternal Grandfather        colon   Cancer Paternal Grandmother        cervical or colon????   Cancer Cousin        prostate   Thyroid cancer Neg Hx    Colon polyps Neg Hx    Esophageal cancer Neg Hx     Social History   Socioeconomic History   Marital status: Married    Spouse name: Not on file   Number of children: Not on file   Years of education: Not on file   Highest education level: Not on file  Occupational History   Not on file  Tobacco Use   Smoking status: Never   Smokeless tobacco: Never   Vaping Use   Vaping Use: Never used  Substance and Sexual Activity   Alcohol use: Not Currently   Drug use: No   Sexual activity: Yes    Birth control/protection: Surgical  Other Topics Concern   Not on file  Social History Narrative   Not on file   Social Determinants of Health   Financial Resource Strain: Not on file  Food  Insecurity: Not on file  Transportation Needs: Not on file  Physical Activity: Not on file  Stress: Not on file  Social Connections: Not on file  Intimate Partner Violence: Not on file    ROS Comprehensive ROS Pertinent positive and negative noted in HPI       Objective    BP (!) 132/92   Pulse 78   Temp 97.9 F (36.6 C) (Oral)   Ht '5\' 4"'$  (1.626 m)   Wt 177 lb 6.4 oz (80.5 kg)   SpO2 98%   BMI 30.45 kg/m  Physical exam: General: Vital signs reviewed.  Patient is well-developed and well-nourished, obese female in no acute distress and cooperative with exam. Head: Normocephalic and atraumatic. Eyes: EOMI, conjunctivae normal, no scleral icterus. Neck: Supple, trachea midline, normal ROM, no JVD, masses, thyromegaly, or carotid bruit present. Cardiovascular: RRR, S1 normal, S2 normal, no murmurs, gallops, or rubs. Pulmonary/Chest: Clear to auscultation bilaterally, no wheezes, rales, or rhonchi. Abdominal: Soft, non-tender, non-distended, BS +, no masses, organomegaly, or guarding present. Musculoskeletal: left foot drop with swelling at the knee and pain when examine /palpated  Extremities: No lower extremity edema bilaterally,  pulses symmetric and intact bilaterally. No cyanosis or clubbing. Neurological: A&O x3, Strength is normal Skin: Warm, dry and intact. No rashes or erythema. Psychiatric: Normal mood and affect. speech and behavior is normal. Cognition and memory are normal.       Assessment & Plan:  Rhonda Gibbs was seen today for new patient (initial visit).  Diagnoses and all orders for this visit:  Establishing care with new PCP,  encounter for Establish care   Acquired left foot drop Per patient after knee surgery unstable gait uses a cane   Essential hypertension Counseled on blood pressure goal of less than 130/80, low-sodium, DASH diet, medication compliance, 150 minutes of moderate intensity exercise per week. Discussed medication compliance, adverse effects. Added  diltiazem (CARDIZEM CD) 120 MG 24 hr capsule; Take 1 capsule (120 mg total) by mouth daily  Class 1 severe obesity due to excess calories with serious comorbidity in adult, unspecified BMI ([place]) Obesity is 30-39 indicating an excess in caloric intake or underlining conditions. This may lead to other co-morbidities. Lifestyle modifications of diet and exercise may reduce obesity.    Other orders/Medication refill. -     chlorthalidone (HYGROTON) 50 MG tablet; Take 1 tablet (50 mg total) by mouth daily. -     atenolol (TENORMIN) 25 MG tablet; Take 1 tablet (25 mg total) by mouth daily. -     lidocaine (LIDODERM) 5 %; Place 1 patch onto the skin daily as needed. Remove & Discard patch within 12 hours or as directed by MD   Kerin Perna, NP

## 2022-05-21 NOTE — Patient Instructions (Signed)
Diltiazem Extended-Release Capsules or Tablets What is this medication? DILTIAZEM (dil TYE a zem) treats high blood pressure and prevents chest pain (angina). It works by relaxing the blood vessels, which helps decrease the amount of work your heart has to do. It belongs to a group of medications called calcium channel blockers. This medicine may be used for other purposes; ask your health care provider or pharmacist if you have questions. COMMON BRAND NAME(S): Cardizem CD, Cardizem LA, Cardizem SR, Cartia XT, Dilacor XR, Dilt-CD, Diltia XT, Diltzac, Matzim LA, Rema Fendt, TIADYLT ER, Tiamate, Tiazac What should I tell my care team before I take this medication? They need to know if you have any of these conditions: Heart attack Heart disease Irregular heartbeat or rhythm Low blood pressure An unusual or allergic reaction to diltiazem, other medications, foods, dyes, or preservatives Pregnant or trying to get pregnant Breast-feeding How should I use this medication? Take this medication by mouth. Take it as directed on the prescription label at the same time every day. Do not cut, crush or chew this medication. Swallow the capsules whole. You can take it with or without food. If it upsets your stomach, take it with food. Keep taking it unless your care team tells you to stop. Talk to your care team about the use of this medication in children. Special care may be needed. Overdosage: If you think you have taken too much of this medicine contact a poison control center or emergency room at once. NOTE: This medicine is only for you. Do not share this medicine with others. What if I miss a dose? If you miss a dose, take it as soon as you can. If it is almost time for your next dose, take only that dose. Do not take double or extra doses. What may interact with this medication? Do not take this medication with any of the following: Cisapride [place] Pimozide Ranolazine Red yeast rice This  medication may also interact with the following: Buspirone Carbamazepine Cimetidine Cyclosporine Digoxin Local anesthetics or general anesthetics Lovastatin Medications for anxiety or difficulty sleeping like midazolam and triazolam Medications for high blood pressure or heart problems Quinidine Rifampin, rifabutin, or rifapentine This list may not describe all possible interactions. Give your health care provider a list of all the medicines, herbs, non-prescription drugs, or dietary supplements you use. Also tell them if you smoke, drink alcohol, or use illegal drugs. Some items may interact with your medicine. What should I watch for while using this medication? Visit your care team for regular checks on your progress. Check your blood pressure as directed. Ask your care team what your blood pressure should be. Do not treat yourself for coughs, colds, or pain while you are using this medication without asking your care team for advice. Some medications may increase your blood pressure. This medication may cause serious skin reactions. They can happen weeks to months after starting the medication. Contact your care team right away if you notice fevers or flu-like symptoms with a rash. The rash may be red or purple and then turn into blisters or peeling of the skin. Or, you might notice a red rash with swelling of the face, lips or lymph nodes in your neck or under your arms. You may get drowsy or dizzy. Do not drive, use machinery, or do anything that needs mental alertness until you know how this medication affects you. Do not stand up or sit up quickly, especially if you are an older patient. This reduces  the risk of dizzy or fainting spells. What side effects may I notice from receiving this medication? Side effects that you should report to your care team as soon as possible: Allergic reactions--skin rash, itching, hives, swelling of the face, lips, tongue, or throat Heart  failure--shortness of breath, swelling of the ankles, feet, or hands, sudden weight gain, unusual weakness or fatigue Slow heartbeat--dizziness, feeling faint or lightheaded, trouble breathing, unusual weakness or fatigue Liver injury--right upper belly pain, loss of appetite, nausea, light-colored stool, dark yellow or brown urine, yellowing skin or eyes, unusual weakness or fatigue Low blood pressure--dizziness, feeling faint or lightheaded, blurry vision Redness, blistering, peeling, or loosening of the skin, including inside the mouth Side effects that usually do not require medical attention (report to your care team if they continue or are bothersome): Constipation Facial flushing, redness Headache This list may not describe all possible side effects. Call your doctor for medical advice about side effects. You may report side effects to FDA at 1-800-FDA-1088. Where should I keep my medication? Keep out of the reach of children and pets. Store at room temperature between 20 and 25 degrees C (68 and 77 degrees F). Protect from moisture. Keep the container tightly closed. Throw away any unused medication after the expiration date. NOTE: This sheet is a summary. It may not cover all possible information. If you have questions about this medicine, talk to your doctor, pharmacist, or health care provider.  2023 Elsevier/Gold Standard (2021-10-19 00:00:00)

## 2022-05-23 ENCOUNTER — Encounter: Payer: Non-veteran care | Admitting: Obstetrics

## 2022-05-23 ENCOUNTER — Encounter: Payer: Self-pay | Admitting: Obstetrics and Gynecology

## 2022-05-23 ENCOUNTER — Other Ambulatory Visit: Payer: Non-veteran care

## 2022-05-23 ENCOUNTER — Other Ambulatory Visit (HOSPITAL_COMMUNITY)
Admission: RE | Admit: 2022-05-23 | Discharge: 2022-05-23 | Disposition: A | Discharge status: Home / Self Care | Payer: No Typology Code available for payment source | Source: Ambulatory Visit | Attending: Family Medicine | Admitting: Family Medicine

## 2022-05-23 ENCOUNTER — Ambulatory Visit (INDEPENDENT_AMBULATORY_CARE_PROVIDER_SITE_OTHER): Payer: No Typology Code available for payment source | Admitting: Obstetrics and Gynecology

## 2022-05-23 VITALS — BP 129/85 | HR 87 | Ht 64.0 in | Wt 180.5 lb

## 2022-05-23 DIAGNOSIS — Z1231 Encounter for screening mammogram for malignant neoplasm of breast: Secondary | ICD-10-CM | POA: Diagnosis not present

## 2022-05-23 DIAGNOSIS — N898 Other specified noninflammatory disorders of vagina: Secondary | ICD-10-CM | POA: Insufficient documentation

## 2022-05-23 DIAGNOSIS — Z113 Encounter for screening for infections with a predominantly sexual mode of transmission: Secondary | ICD-10-CM

## 2022-05-23 DIAGNOSIS — Z01419 Encounter for gynecological examination (general) (routine) without abnormal findings: Secondary | ICD-10-CM

## 2022-05-23 DIAGNOSIS — Z90710 Acquired absence of both cervix and uterus: Secondary | ICD-10-CM | POA: Diagnosis not present

## 2022-05-23 DIAGNOSIS — N951 Menopausal and female climacteric states: Secondary | ICD-10-CM

## 2022-05-23 MED ORDER — ESTRADIOL 0.05 MG/24HR TD PTWK: 0.0500 mg | MEDICATED_PATCH | TRANSDERMAL | 12 refills | Status: DC

## 2022-05-23 NOTE — Progress Notes (Signed)
Pt presents to re-establish care. Pt states she was told she has "vaginal cyst" 1 year ago and did not receive treatment. No other compliant at this time.

## 2022-05-24 ENCOUNTER — Telehealth: Payer: Self-pay | Admitting: Primary Care

## 2022-05-24 LAB — CERVICOVAGINAL ANCILLARY ONLY
Bacterial Vaginitis (gardnerella): NEGATIVE
Candida Glabrata: NEGATIVE
Candida Vaginitis: NEGATIVE
Chlamydia: NEGATIVE
Comment: NEGATIVE
Comment: NEGATIVE
Comment: NEGATIVE
Comment: NEGATIVE
Comment: NEGATIVE
Comment: NORMAL
Neisseria Gonorrhea: NEGATIVE
Trichomonas: NEGATIVE

## 2022-05-24 LAB — RPR: RPR Ser Ql: NONREACTIVE

## 2022-05-24 LAB — HEPATITIS B SURFACE ANTIGEN: Hepatitis B Surface Ag: NEGATIVE

## 2022-05-24 LAB — HIV ANTIBODY (ROUTINE TESTING W REFLEX): HIV Screen 4th Generation wRfx: NONREACTIVE

## 2022-05-24 LAB — HEPATITIS C ANTIBODY: Hep C Virus Ab: NONREACTIVE

## 2022-05-27 NOTE — Telephone Encounter (Signed)
Unclear why message was routed to CMA. Last OV note does not mention labs and nothing was ordered. If patient needs labs please place orders. So that she can be scheduled for lab only appt.

## 2022-05-29 ENCOUNTER — Other Ambulatory Visit (INDEPENDENT_AMBULATORY_CARE_PROVIDER_SITE_OTHER): Payer: Self-pay | Admitting: Primary Care

## 2022-05-29 DIAGNOSIS — I10 Essential (primary) hypertension: Secondary | ICD-10-CM

## 2022-05-29 DIAGNOSIS — Z13220 Encounter for screening for lipoid disorders: Secondary | ICD-10-CM

## 2022-05-29 NOTE — Telephone Encounter (Signed)
Called patient and left message per DPR informing her that lab orders have been placed. PCP would like her to schedule appointment for Bp recheck at which time labs can be collected. Left office number for patient to call back and schedule.

## 2022-06-06 ENCOUNTER — Encounter: Payer: Non-veteran care | Admitting: Family Medicine

## 2022-07-03 ENCOUNTER — Ambulatory Visit (INDEPENDENT_AMBULATORY_CARE_PROVIDER_SITE_OTHER): Payer: Non-veteran care | Admitting: Primary Care

## 2022-07-23 ENCOUNTER — Ambulatory Visit (INDEPENDENT_AMBULATORY_CARE_PROVIDER_SITE_OTHER): Payer: Non-veteran care | Admitting: Primary Care

## 2022-07-25 ENCOUNTER — Ambulatory Visit (INDEPENDENT_AMBULATORY_CARE_PROVIDER_SITE_OTHER): Payer: Non-veteran care | Admitting: Primary Care

## 2022-07-31 ENCOUNTER — Ambulatory Visit (INDEPENDENT_AMBULATORY_CARE_PROVIDER_SITE_OTHER): Payer: Non-veteran care | Admitting: Primary Care

## 2022-08-01 ENCOUNTER — Encounter (INDEPENDENT_AMBULATORY_CARE_PROVIDER_SITE_OTHER): Payer: Self-pay | Admitting: Primary Care

## 2022-08-22 ENCOUNTER — Ambulatory Visit (INDEPENDENT_AMBULATORY_CARE_PROVIDER_SITE_OTHER): Payer: Non-veteran care | Admitting: Primary Care

## 2022-08-29 ENCOUNTER — Ambulatory Visit (INDEPENDENT_AMBULATORY_CARE_PROVIDER_SITE_OTHER): Payer: Non-veteran care | Admitting: Primary Care

## 2022-10-10 ENCOUNTER — Encounter: Payer: Self-pay | Admitting: Internal Medicine

## 2023-07-02 ENCOUNTER — Encounter: Payer: Self-pay | Admitting: Internal Medicine

## 2023-07-02 ENCOUNTER — Encounter (INDEPENDENT_AMBULATORY_CARE_PROVIDER_SITE_OTHER): Payer: Self-pay | Admitting: Internal Medicine

## 2023-07-02 ENCOUNTER — Ambulatory Visit (INDEPENDENT_AMBULATORY_CARE_PROVIDER_SITE_OTHER): Payer: No Typology Code available for payment source | Admitting: Internal Medicine

## 2023-07-02 VITALS — BP 161/100 | HR 82 | Temp 98.1°F | Ht 64.0 in | Wt 192.1 lb

## 2023-07-02 DIAGNOSIS — R1084 Generalized abdominal pain: Secondary | ICD-10-CM | POA: Diagnosis not present

## 2023-07-02 DIAGNOSIS — K219 Gastro-esophageal reflux disease without esophagitis: Secondary | ICD-10-CM

## 2023-07-02 DIAGNOSIS — K581 Irritable bowel syndrome with constipation: Secondary | ICD-10-CM

## 2023-07-02 DIAGNOSIS — R1319 Other dysphagia: Secondary | ICD-10-CM | POA: Diagnosis not present

## 2023-07-02 MED ORDER — PANTOPRAZOLE SODIUM 40 MG PO TBEC: 40.0000 mg | DELAYED_RELEASE_TABLET | Freq: Every day | ORAL | 11 refills | Status: DC

## 2023-07-02 NOTE — Patient Instructions (Addendum)
For your chronic acid reflux, I am going to start you on pantoprazole 40 mg daily.  I have sent this to your pharmacy.  For your IBS with constipation, I want you to do a bowel cleanse with MiraLAX and then start taking Linzess 290 mcg tablets the next day.  For the bowel cleanse, buy 1 bottle (8.3 oz, 238g) of MiraLAX (polyethylene glycol)  and mix in 64 ounces of clear liquid of your choice.   Drink 8oz at a time every 30 min to an hour until you have complete bowel movements.   Keep drinking clear liquids to stay hydrated and flush out your colon.  Follow up in 3 months  It was nice seeing you again today.  Dr. Marletta Lor

## 2023-07-02 NOTE — Progress Notes (Signed)
Referring Provider: Grayce Sessions, NP Primary Care Physician:  Agustina Caroli, MD Primary GI:  Dr. Marletta Lor  Chief Complaint  Patient presents with   Abdominal Pain    Having abdominal pain and is having issues with swallowing again    HPI:   Rhonda Gibbs is a 52 y.o. female who presents to clinic today for follow-up visit.  History of chronic GERD previously well-controlled on omeprazole daily, not taking this medication anymore.  Notes issues with heartburn and reflux.  History of esophageal dysphagia.  Underwent EGD 09/17/2021 which showed esophageal stricture status post dilation with 20 mm balloon.  Swallowing improved after this but now stating she is starting to have issues again. Feels like food is getting stuck in her substernal region.  Does have chronic constipation.  States she takes MiraLAX 3 times daily with little improvement.  Averaging 1-2 bowel movements a week.  Does have some straining.  Also with associated abdominal bloating.  Improved after bowel movements.    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 "borderline"   Osteoporosis    Sleep apnea    has a cpap but does not use   Thyroid disease     Past Surgical History:  Procedure Laterality Date   ABDOMINAL HYSTERECTOMY     partial   BALLOON DILATION N/A 09/17/2021   Procedure: BALLOON DILATION;  Surgeon: Lanelle Bal, DO;  [facility];  Service: Endoscopy;  Laterality: N/A;   BIOPSY  09/17/2021   Procedure: BIOPSY;  Surgeon: Lanelle Bal, DO;  [facility];  Service: Endoscopy;;   CHONDROPLASTY Left 08/03/2015   Procedure: CHONDROPLASTY;   Surgeon: Loreta Ave, MD;  Location: [facility];  Service: Orthopedics;  Laterality: Left;   CLOSED MANIPULATION KNEE WITH STERIOD INJECTION Left 02/06/2017   Procedure: Closed MANIPULATION of knee with Steroid Injection;  Surgeon: Loreta Ave, MD;  Location: [facility];  Service: Orthopedics;  Laterality: Left;   COLONOSCOPY  04/04/2020   Dr. Barron Alvine; 5 tubular adenomas in the sigmoid and ascending colon, multiple hyperplastic polyp in the rectum, diverticulosis in the sigmoid colon, nonbleeding internal hemorrhoids.  Repeat in 3 years.   ESOPHAGOGASTRODUODENOSCOPY (EGD) WITH PROPOFOL N/A 09/17/2021   Procedure: ESOPHAGOGASTRODUODENOSCOPY (EGD) WITH PROPOFOL;  Surgeon: Lanelle Bal, DO;  [facility];  Service: Endoscopy;  Laterality: N/A;  7:30am   HERNIA REPAIR     umbilical   JOINT REPLACEMENT Left 06/26/2016   Dr Eulah Pont   KNEE ARTHROSCOPY WITH LATERAL RELEASE Left 08/03/2015   Procedure: LEFT KNEE ARTHROSCOPY  CHONDROPLASTY WITH LATERAL RELEASE;  Surgeon: Loreta Ave, MD;  Location: [facility];  Service: Orthopedics;  Laterality: Left;   TOTAL KNEE ARTHROPLASTY Left 06/26/2016   Procedure: TOTAL KNEE ARTHROPLASTY;  Surgeon: Loreta Ave, MD;  Location: [facility];  Service: Orthopedics;  Laterality: Left;    Current Outpatient Medications  Medication Sig Dispense Refill   alprazolam (XANAX) 2 MG tablet Take 2 mg by  mouth 3 times/day as needed-between meals & bedtime for sleep. 1 tab PO 1 hour before EMG.     atenolol (TENORMIN) 25 MG tablet Take 1 tablet (25 mg total) by mouth daily. 90 tablet 1   carboxymethylcellulose (REFRESH PLUS) 0.5 % SOLN Place 1 drop into both eyes 3 (three) times daily as needed (dry eye).     chlorthalidone (HYGROTON) 50 MG tablet Take 1 tablet (50 mg total) by mouth daily. 90 tablet 1   clobetasol ointment (TEMOVATE) 0.05 % Apply thin film to external vulvar area nightly for 1 month, then  switch to every other night for 2 weeks, then decrease use to 2 nights per week only. Wash hands after use 60 g 1   diltiazem (CARDIZEM CD) 120 MG 24 hr capsule Take 1 capsule (120 mg total) by mouth daily. 90 capsule 0   estradiol (CLIMARA) 0.05 mg/24hr patch Place 1 patch (0.05 mg total) onto the skin once a week. 4 patch 12   fluticasone (FLONASE) 50 MCG/ACT nasal spray Place 2 sprays into both nostrils daily as needed for rhinitis.     indomethacin (INDOCIN) 50 MG capsule Take 50 mg by mouth daily as needed (gout).     lidocaine (LIDODERM) 5 % Place 1 patch onto the skin daily as needed. Remove & Discard patch within 12 hours or as directed by MD 30 patch 6   MENTHOL-METHYL SALICYLATE EX Apply topically as needed. Apply small amount to affected area four times daily.     Multiple Vitamin (MULTIVITAMIN ADULT PO) Take by mouth as needed.     omeprazole (PRILOSEC OTC) 20 MG tablet Take 1 tablet (20 mg total) by mouth 2 (two) times daily before a meal. 180 tablet 3   polyethylene glycol (MIRALAX / GLYCOLAX) 17 g packet Take 17 g by mouth daily as needed.     PRAZOSIN HCL PO Take 2 mg by mouth daily at 6 (six) AM.     psyllium (METAMUCIL) 58.6 % packet Take 1 packet by mouth daily as needed.     Rimegepant Sulfate 75 MG TBDP Take 1 tablet by mouth as needed.     sertraline (ZOLOFT) 100 MG tablet Take 500 mg by mouth daily as needed.     SUMAtriptan (IMITREX) 100 MG tablet Take 100 mg by mouth every 2 (two) hours as needed for migraine. First dose at onset of headache, then repeat in 2 hours PRN.     tiZANidine (ZANAFLEX) 4 MG capsule Take 4 mg by mouth 3 (three) times daily as needed.     traZODone (DESYREL) 50 MG tablet Take 50 mg by mouth at bedtime as needed.     No current facility-administered medications for this visit.    Allergies as of 07/02/2023 - Review Complete 05/23/2022  Allergen Reaction Noted   No known allergies  06/25/2016   Metoprolol Rash 03/01/2010    Family History   Problem Relation Age of Onset   Hypertension Mother    Thyroid disease Mother        Had surgery, unknown diagnosis   Diabetes Father    Hypertension Father    Heart disease Father        chf   Thyroid disease Sister    Hypertension Brother    Stomach cancer Brother    Gastric cancer Brother    Colon cancer Maternal Grandmother    Stomach cancer Maternal Grandmother    Gastric cancer Maternal Grandmother    Cancer Maternal Grandfather  colon   Cancer Paternal Grandmother        cervical or colon????   Cancer Cousin        prostate   Thyroid cancer Neg Hx    Colon polyps Neg Hx    Esophageal cancer Neg Hx     Social History   Socioeconomic History   Marital status: Married    Spouse name: Not on file   Number of children: Not on file   Years of education: Not on file   Highest education level: Not on file  Occupational History   Not on file  Tobacco Use   Smoking status: Never   Smokeless tobacco: Never  Vaping Use   Vaping status: Never Used  Substance and Sexual Activity   Alcohol use: Not Currently   Drug use: No   Sexual activity: Yes    Birth control/protection: Surgical  Other Topics Concern   Not on file  Social History Narrative   Not on file   Social Determinants of Health   Financial Resource Strain: Not on file  Food Insecurity: No Food Insecurity (04/17/2022)   Received from [facility], [facility]   Hunger Vital Sign    Worried About Running Out of Food in the Last Year: Never true    Ran Out of Food in the Last Year: Never true  Transportation Needs: Not on file  Physical Activity: Not on file  Stress: Not on file  Social Connections: Unknown (04/04/2022)   Received from [facility], [facility]   Social Network    Social Network: Not on file    Subjective: Review of Systems  Constitutional:  Negative for chills and fever.  HENT:  Negative for congestion and hearing loss.   Eyes:  Negative for blurred vision and double  vision.  Respiratory:  Negative for cough and shortness of breath.   Cardiovascular:  Negative for chest pain and palpitations.  Gastrointestinal:  Positive for constipation and heartburn. Negative for abdominal pain, blood in stool, diarrhea, melena and vomiting.       Bloating  Genitourinary:  Negative for dysuria and urgency.  Musculoskeletal:  Negative for joint pain and myalgias.  Skin:  Negative for itching and rash.  Neurological:  Negative for dizziness and headaches.  Psychiatric/Behavioral:  Negative for depression. The patient is not nervous/anxious.      Objective: There were no vitals taken for this visit. Physical Exam Constitutional:      Appearance: Normal appearance.  HENT:     Head: Normocephalic and atraumatic.  Eyes:     Extraocular Movements: Extraocular movements intact.     Conjunctiva/sclera: Conjunctivae normal.  Cardiovascular:     Rate and Rhythm: Normal rate and regular rhythm.  Pulmonary:     Effort: Pulmonary effort is normal.     Breath sounds: Normal breath sounds.  Abdominal:     General: Bowel sounds are normal.     Palpations: Abdomen is soft.  Musculoskeletal:        General: No swelling. Normal range of motion.     Cervical back: Normal range of motion and neck supple.  Skin:    General: Skin is warm and dry.     Coloration: Skin is not jaundiced.  Neurological:     General: No focal deficit present.     Mental Status: She is alert and oriented to person, place, and time.  Psychiatric:        Mood and Affect: Mood normal.  Behavior: Behavior normal.      Assessment: *GERD not well-controlled *Dysphagia-worsening *Chronic constipation *Abdominal bloating  Plan: GERD not well-controlled, will send prescription for pantoprazole 40 mg daily.  Dysphagia symptoms have started to recur.  History of esophageal stricture.  Offered upper endoscopy with repeat dilation the patient would like to hold off and concentrate on her  bowels for now.  Readdress on follow-up visit.  For patient's abdominal bloating chronic constipation, likely irritable bowel syndrome-constipation predominant.  Will give instructions for MiraLAX purge followed by Linzess 290 mcg daily.   Otherwise follow-up in 3 months  07/02/2023 3:09 PM   Disclaimer: This note was dictated with voice recognition software. Similar sounding words can inadvertently be transcribed and may not be corrected upon review.

## 2023-07-07 ENCOUNTER — Other Ambulatory Visit: Payer: Self-pay | Admitting: Nephrology

## 2023-07-07 DIAGNOSIS — N182 Chronic kidney disease, stage 2 (mild): Secondary | ICD-10-CM

## 2023-09-08 ENCOUNTER — Ambulatory Visit
Admission: RE | Admit: 2023-09-08 | Discharge: 2023-09-08 | Disposition: A | Discharge status: Home / Self Care | Payer: Non-veteran care | Source: Ambulatory Visit | Attending: Nephrology | Admitting: Nephrology

## 2023-09-08 DIAGNOSIS — N182 Chronic kidney disease, stage 2 (mild): Secondary | ICD-10-CM

## 2023-10-16 ENCOUNTER — Emergency Department (HOSPITAL_COMMUNITY)
Admission: EM | Admit: 2023-10-16 | Discharge: 2023-10-17 | Disposition: A | Discharge status: Home / Self Care | Payer: No Typology Code available for payment source | Attending: Emergency Medicine | Admitting: Emergency Medicine

## 2023-10-16 ENCOUNTER — Encounter (HOSPITAL_COMMUNITY): Payer: Self-pay | Admitting: Emergency Medicine

## 2023-10-16 ENCOUNTER — Other Ambulatory Visit: Payer: Self-pay

## 2023-10-16 DIAGNOSIS — R112 Nausea with vomiting, unspecified: Secondary | ICD-10-CM | POA: Insufficient documentation

## 2023-10-16 DIAGNOSIS — I10 Essential (primary) hypertension: Secondary | ICD-10-CM | POA: Insufficient documentation

## 2023-10-16 DIAGNOSIS — R1084 Generalized abdominal pain: Secondary | ICD-10-CM | POA: Diagnosis present

## 2023-10-16 DIAGNOSIS — Z79899 Other long term (current) drug therapy: Secondary | ICD-10-CM | POA: Insufficient documentation

## 2023-10-16 LAB — CBC
HCT: 42.4 % (ref 36.0–46.0)
Hemoglobin: 14.2 g/dL (ref 12.0–15.0)
MCH: 31.5 pg (ref 26.0–34.0)
MCHC: 33.5 g/dL (ref 30.0–36.0)
MCV: 94 fL (ref 80.0–100.0)
Platelets: 340 10*3/uL (ref 150–400)
RBC: 4.51 MIL/uL (ref 3.87–5.11)
RDW: 13.5 % (ref 11.5–15.5)
WBC: 7.8 10*3/uL (ref 4.0–10.5)
nRBC: 0 % (ref 0.0–0.2)

## 2023-10-16 NOTE — ED Triage Notes (Signed)
Patient complaining of left sided abdominal pain. Patient states she has not had bowel movement in 1 week despite otc medications and enema. Endorses n/v.

## 2023-10-17 ENCOUNTER — Emergency Department (HOSPITAL_COMMUNITY): Payer: No Typology Code available for payment source

## 2023-10-17 LAB — COMPREHENSIVE METABOLIC PANEL
ALT: 14 U/L (ref 0–44)
AST: 21 U/L (ref 15–41)
Albumin: 4.6 g/dL (ref 3.5–5.0)
Alkaline Phosphatase: 80 U/L (ref 38–126)
Anion gap: 17 — ABNORMAL HIGH (ref 5–15)
BUN: 14 mg/dL (ref 6–20)
CO2: 22 mmol/L (ref 22–32)
Calcium: 10 mg/dL (ref 8.9–10.3)
Chloride: 99 mmol/L (ref 98–111)
Creatinine, Ser: 0.7 mg/dL (ref 0.44–1.00)
GFR, Estimated: >60 mL/min (ref >60)
Glucose, Bld: 77 mg/dL (ref 70–99)
Potassium: 3.2 mmol/L — ABNORMAL LOW (ref 3.5–5.1)
Sodium: 138 mmol/L (ref 135–145)
Total Bilirubin: 1.2 mg/dL — ABNORMAL HIGH (ref <1.2)
Total Protein: 8.8 g/dL — ABNORMAL HIGH (ref 6.5–8.1)

## 2023-10-17 LAB — LIPASE, BLOOD: Lipase: 23 U/L (ref 11–51)

## 2023-10-17 LAB — URINALYSIS, ROUTINE W REFLEX MICROSCOPIC
Bilirubin Urine: NEGATIVE
Glucose, UA: NEGATIVE mg/dL
Hgb urine dipstick: NEGATIVE
Ketones, ur: 20 mg/dL — AB
Leukocytes,Ua: NEGATIVE
Nitrite: NEGATIVE
Protein, ur: NEGATIVE mg/dL
Specific Gravity, Urine: >1.046 — ABNORMAL HIGH (ref 1.005–1.030)
pH: 5 (ref 5.0–8.0)

## 2023-10-17 MED ORDER — ONDANSETRON HCL 4 MG/2ML IJ SOLN
4.0000 mg | Freq: Once | INTRAMUSCULAR | Status: AC
  Administered 2023-10-17: 4 mg via INTRAVENOUS
  Filled 2023-10-17: qty 2

## 2023-10-17 MED ORDER — METOCLOPRAMIDE HCL 5 MG PO TABS: 5.0000 mg | ORAL_TABLET | Freq: Three times a day (TID) | ORAL | 0 refills | Status: DC | PRN

## 2023-10-17 MED ORDER — IOHEXOL 300 MG/ML  SOLN
100.0000 mL | Freq: Once | INTRAMUSCULAR | Status: AC | PRN
  Administered 2023-10-17: 100 mL via INTRAVENOUS

## 2023-10-17 NOTE — ED Provider Notes (Signed)
[place] EMERGENCY DEPARTMENT AT [facility] Provider Note  CSN: [id number] Arrival date & time: 10/16/23 2304  Chief Complaint(s) Abdominal Pain  HPI Rhonda Gibbs is a 52 y.o. female with a past medical history listed below who presents to the emergency department for 1 week of abdominal discomfort and inability to have a bowel movement.  Patient does have a history of constipation and takes Linzess.  Patient feels bloated.  She has tried taking over-the-counter medicine as well as enemas to try to have a bowel movement but has been unsuccessful.  She does endorse associated nausea and nonbloody nonbilious emesis. Patient reports she has not had anything to eat in the last 3 days due to pain and emesis.  No prior history of small bowel obstruction but patient is status post abdominal hysterectomy and hernia repair.  The history is provided by the patient.    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 "borderline"   Osteoporosis    Sleep apnea    has a cpap but does not use   Thyroid disease    Patient Active Problem List   Diagnosis Date Noted   S/P abdominal hysterectomy 05/23/2022   Menopausal vasomotor syndrome 05/23/2022   Constipation 08/23/2021   Generalized abdominal pain 08/23/2021   Dysphagia 08/23/2021   Rectal pain 08/23/2021   Primary localized osteoarthritis of left knee 06/26/2016   HTN (hypertension) 01/31/2014   Home Medication(s) Prior to Admission medications   Medication Sig Start Date End Date Taking? Authorizing Provider  metoCLOPramide (REGLAN) 5 MG tablet Take 1 tablet (5 mg total) by mouth every 8  (eight) hours as needed for nausea or vomiting. 10/17/23  Yes Marqual Mi, Amadeo Garnet, MD  atenolol (TENORMIN) 25 MG tablet Take 1 tablet (25 mg total) by mouth daily. 05/21/22   Grayce Sessions, NP  carboxymethylcellulose (REFRESH PLUS) 0.5 % SOLN Place 1 drop into both eyes 3 (three) times daily as needed (dry eye).    [provider]  chlorthalidone (HYGROTON) 50 MG tablet Take 1 tablet (50 mg total) by mouth daily. 05/21/22   Grayce Sessions, NP  clobetasol ointment (TEMOVATE) 0.05 % Apply thin film to external vulvar area nightly for 1 month, then switch to every other night for 2 weeks, then decrease use to 2 nights per week only. Wash hands after use 12/05/20   Theresia Majors, MD  diltiazem (CARDIZEM CD) 120 MG 24 hr capsule Take 1 capsule (120 mg total) by mouth daily. 05/21/22   Grayce Sessions, NP  estradiol (CLIMARA) 0.05 mg/24hr patch Place 1 patch (0.05 mg total) onto the skin once a week. 05/23/22   Warden Fillers, MD  fluticasone (FLONASE) 50 MCG/ACT nasal spray Place 2 sprays into both nostrils daily as needed for rhinitis.    [provider]  indomethacin (INDOCIN) 50 MG capsule Take 50 mg by mouth daily as needed (gout).    [provider]  lidocaine (LIDODERM) 5 % Place 1 patch onto the skin daily as needed. Remove &  Discard patch within 12 hours or as directed by MD 05/21/22   Grayce Sessions, NP  MENTHOL-METHYL SALICYLATE EX Apply topically as needed. Apply small amount to affected area four times daily.    [provider]  Multiple Vitamin (MULTIVITAMIN ADULT PO) Take by mouth as needed.    [provider]  pantoprazole (PROTONIX) 40 MG tablet Take 1 tablet (40 mg total) by mouth daily. 07/02/23 07/01/24  Lanelle Bal, DO  polyethylene glycol (MIRALAX / GLYCOLAX) 17 g packet Take 17 g by mouth daily as needed.    [provider]  PRAZOSIN HCL PO Take 2 mg by mouth daily at 6 (six) AM.    [provider]   psyllium (METAMUCIL) 58.6 % packet Take 1 packet by mouth daily as needed.    [provider]  Rimegepant Sulfate 75 MG TBDP Take 1 tablet by mouth as needed. 07/06/21   [provider]  sertraline (ZOLOFT) 100 MG tablet Take 500 mg by mouth daily as needed.    [provider]  SUMAtriptan (IMITREX) 100 MG tablet Take 100 mg by mouth every 2 (two) hours as needed for migraine. First dose at onset of headache, then repeat in 2 hours PRN.    [provider]  tiZANidine (ZANAFLEX) 4 MG capsule Take 4 mg by mouth 3 (three) times daily as needed.    [provider]  traZODone (DESYREL) 50 MG tablet Take 50 mg by mouth at bedtime as needed.    [provider]                                                                                                                                    Allergies No known allergies and Metoprolol  Review of Systems Review of Systems As noted in HPI  Physical Exam Vital Signs  I have reviewed the triage vital signs BP (!) 170/104 (BP Location: Left Arm)   Pulse (!) 108   Temp 99.3 F (37.4 C) (Oral)   Resp 18   Ht 5\' 4"  (1.626 m)   Wt 85.7 kg   SpO2 95%   BMI 32.44 kg/m   Physical Exam Vitals reviewed.  Constitutional:      General: She is not in acute distress.    Appearance: She is well-developed. She is not diaphoretic.  HENT:     Head: Normocephalic and atraumatic.     Right Ear: External ear normal.     Left Ear: External ear normal.     Nose: Nose normal.  Eyes:     General: No scleral icterus.    Conjunctiva/sclera: Conjunctivae normal.  Neck:     Trachea: Phonation normal.  Cardiovascular:     Rate and Rhythm: Normal rate and regular rhythm.  Pulmonary:     Effort: Pulmonary effort is normal. No respiratory distress.     Breath sounds: No stridor.  Abdominal:  General: A surgical scar is present. There is distension (mild).     Tenderness: There is generalized abdominal  tenderness (mild discomfort). There is no guarding or rebound.  Musculoskeletal:        General: Normal range of motion.     Cervical back: Normal range of motion.  Neurological:     Mental Status: She is alert and oriented to person, place, and time.  Psychiatric:        Behavior: Behavior normal.     ED Results and Treatments Labs (all labs ordered are listed, but only abnormal results are displayed) Labs Reviewed  COMPREHENSIVE METABOLIC PANEL - Abnormal; Notable for the following components:      Result Value   Potassium 3.2 (*)    Total Protein 8.8 (*)    Total Bilirubin 1.2 (*)    Anion gap 17 (*)    All other components within normal limits  URINALYSIS, ROUTINE W REFLEX MICROSCOPIC - Abnormal; Notable for the following components:   Specific Gravity, Urine >1.046 (*)    Ketones, ur 20 (*)    All other components within normal limits  LIPASE, BLOOD  CBC                                                                                                                         EKG  EKG Interpretation Date/Time:    Ventricular Rate:    PR Interval:    QRS Duration:    QT Interval:    QTC Calculation:   R Axis:      Text Interpretation:         Radiology CT ABDOMEN PELVIS W CONTRAST  Result Date: 10/17/2023 CLINICAL DATA:  Bowel obstruction suspected EXAM: CT ABDOMEN AND PELVIS WITH CONTRAST TECHNIQUE: Multidetector CT imaging of the abdomen and pelvis was performed using the standard protocol following bolus administration of intravenous contrast. RADIATION DOSE REDUCTION: This exam was performed according to the departmental dose-optimization program which includes automated exposure control, adjustment of the mA and/or kV according to patient size and/or use of iterative reconstruction technique. CONTRAST:  OMNIPAQUE IOHEXOL 300 MG/ML  SOLN COMPARISON:  Ultrasound renal 09/08/2023 FINDINGS: Lower chest: Lingular and left lower lobe atelectasis. No acute  abnormality. Hepatobiliary: No focal liver abnormality. No gallstones, gallbladder wall thickening, or pericholecystic fluid. No biliary dilatation. Pancreas: No focal lesion. Normal pancreatic contour. No surrounding inflammatory changes. No main pancreatic ductal dilatation. Spleen: Normal in size without focal abnormality. Adrenals/Urinary Tract: No adrenal nodule bilaterally. Bilateral kidneys enhance symmetrically. Subcentimeter hypodensity too small to characterize-no further follow-up indicated. No hydronephrosis. No hydroureter. The urinary bladder is unremarkable. On delayed imaging, there is no urothelial wall thickening and there are no filling defects in the opacified portions of the bilateral collecting systems or ureters. Stomach/Bowel: Stomach is within normal limits. No evidence of bowel wall thickening or dilatation. Few scattered colonic diverticula. Appendix appears normal. Vascular/Lymphatic: No abdominal aorta or iliac aneurysm. No abdominal, pelvic, or inguinal lymphadenopathy.  Reproductive: Status post hysterectomy. No adnexal masses. Other: No intraperitoneal free fluid. No intraperitoneal free gas. No organized fluid collection. Musculoskeletal: No abdominal wall hernia or abnormality. No suspicious lytic or blastic osseous lesions. No acute displaced fracture. Right hip degenerative changes. IMPRESSION: Colonic diverticulosis with no acute diverticulitis. Electronically Signed   By: Tish Frederickson M.D.   On: 10/17/2023 01:12    Medications Ordered in ED Medications  ondansetron (ZOFRAN) injection 4 mg (4 mg Intravenous Given 10/17/23 0053)  iohexol (OMNIPAQUE) 300 MG/ML solution 100 mL (100 mLs Intravenous Contrast Given 10/17/23 0030)   Procedures Procedures  (including critical care time) Medical Decision Making / ED Course   Medical Decision Making Amount and/or Complexity of Data Reviewed Labs: ordered. Decision-making details documented in ED Course. Radiology: ordered  and independent interpretation performed. Decision-making details documented in ED Course.  Risk Prescription drug management. Decision regarding hospitalization.    Generalized abdominal discomfort with distention.  Differential workup listed below  Ruling out constipation, fecal impaction versus small bowel obstruction.  CBC without leukocytosis or anemia CMP without significant electrolyte derangements or renal insufficiency.  No evidence of bili obstruction or pancreatitis UA without evidence of infection CT scan notable for diverticulosis without evidence of diverticulitis.  No evidence of bowel obstruction.  Patient has mild fecal matter in the colon. Gastric content more than expected based on history. No gastric outlet obstruction noted.   Will Rx reglan and have her f/u with GI to discuss possible gastric emptying test to assess for gastroparesis.      Final Clinical Impression(s) / ED Diagnoses Final diagnoses:  Generalized abdominal pain   The patient appears reasonably screened and/or stabilized for discharge and I doubt any other medical condition or other [facility] requiring further screening, evaluation, or treatment in the ED at this time. I have discussed the findings, Dx and Tx plan with the patient/family who expressed understanding and agree(s) with the plan. Discharge instructions discussed at length. The patient/family was given strict return precautions who verbalized understanding of the instructions. No further questions at time of discharge.  Disposition: Discharge  Condition: Good  ED Discharge Orders          Ordered    metoCLOPramide (REGLAN) 5 MG tablet  Every 8 hours PRN        10/17/23 0234              Follow Up: Agustina Caroli, MD [address] [place] [address] [phone]  Call  to schedule an appointment for close follow up  Lanelle Bal, DO [address] [place] [address] [phone]  Call  to schedule an  appointment for close follow up to discuss possible gastric emptying test    This chart was dictated using voice recognition software.  Despite best efforts to proofread,  errors can occur which can change the documentation meaning.    Nira Conn, MD 10/17/23 [phone]

## 2023-10-23 ENCOUNTER — Encounter: Payer: Self-pay | Admitting: Internal Medicine

## 2023-10-23 ENCOUNTER — Ambulatory Visit (INDEPENDENT_AMBULATORY_CARE_PROVIDER_SITE_OTHER): Payer: No Typology Code available for payment source | Admitting: Internal Medicine

## 2023-10-23 VITALS — BP 135/97 | HR 86 | Temp 98.6°F | Ht 64.0 in | Wt 184.2 lb

## 2023-10-23 DIAGNOSIS — K219 Gastro-esophageal reflux disease without esophagitis: Secondary | ICD-10-CM

## 2023-10-23 DIAGNOSIS — R109 Unspecified abdominal pain: Secondary | ICD-10-CM | POA: Diagnosis not present

## 2023-10-23 DIAGNOSIS — R140 Abdominal distension (gaseous): Secondary | ICD-10-CM

## 2023-10-23 DIAGNOSIS — K581 Irritable bowel syndrome with constipation: Secondary | ICD-10-CM

## 2023-10-23 DIAGNOSIS — R1084 Generalized abdominal pain: Secondary | ICD-10-CM

## 2023-10-23 NOTE — Patient Instructions (Signed)
We need to get your bowels moving.  Would recommend enema at home followed by MiraLAX purge.  Take 17 g (1 capful or 1 packet) of MiraLAX mixed into water, Gatorade, or Powerade followed by 8 ounces of plain water.  Do this once an hour until you have a good bowel movement, but no more than 5 doses.  After you have emptied your bowels, would recommend taking Linzess 72 mcg daily.  Let me know in a week if you are doing better and I can send in formal prescription.  Follow-up in 4 to 6 weeks.  It was very nice seeing you again today.  Dr. Marletta Lor

## 2023-10-23 NOTE — Progress Notes (Signed)
Referring Provider: Agustina Caroli, MD Primary Care Physician:  Agustina Caroli, MD Primary GI:  Dr. Marletta Lor  Chief Complaint  Patient presents with   New Patient (Initial Visit)    Referred from the ED over the weekend. Still having abd pain and pt states no BM yet    HPI:   Rhonda Gibbs is a 52 y.o. female who presents to clinic today for ER follow-up visit.  Presented to [facility], ER 10/17/2023 with worsening abdominal pain.  She has a history of IBS with constipation.  She was controlling this with probiotic, fiber Gummies, previously given Linzess to 90 mcg samples which was too strong.  States she was doing well until approximately 2 to 3 days prior to ER visit when she had worsening abdominal pain as well as inability to have bowel movement.  Tried enema at home which did not help.  Also with associated nausea and vomiting.  CT abdomen pelvis with contrast in the ER was read as normal.  On my personal review, appears that she has a distended stomach as well as moderate stool burden right side of her colon and transverse colon.  Was treated conservatively and discharged home on metoclopramide for concern for gastroparesis.  Today, states she is yet to have a BM.  Has been drinking water.  Notes some abdominal bloating.  Nausea and vomiting have resolved.  Chronic GERD: Well-controlled on pantoprazole daily.  Esophageal dysphagia.  Underwent EGD 09/17/2021 which showed esophageal stricture status post dilation with 20 mm balloon.  Swallowing improved after dilation   Past Medical History:  Diagnosis Date   Anemia    Anxiety    Arthritis    Cataract    Depression    High cholesterol    Hypertension    Migraine    Myocardial infarction [facility])    years ago   Neuromuscular disorder (HCC)    drop foot on left, numbness in left foot and left side of knee   OSA (obstructive sleep apnea)    states she has never used CPAP. "borderline"   Osteoporosis    Sleep apnea     has a cpap but does not use   Thyroid disease     Past Surgical History:  Procedure Laterality Date   ABDOMINAL HYSTERECTOMY     partial   BALLOON DILATION N/A 09/17/2021   Procedure: BALLOON DILATION;  Surgeon: Lanelle Bal, DO;  [facility];  Service: Endoscopy;  Laterality: N/A;   BIOPSY  09/17/2021   Procedure: BIOPSY;  Surgeon: Lanelle Bal, DO;  [facility];  Service: Endoscopy;;   CHONDROPLASTY Left 08/03/2015   Procedure: CHONDROPLASTY;  Surgeon: Loreta Ave, MD;  Location: [facility];  Service: Orthopedics;  Laterality: Left;   CLOSED MANIPULATION KNEE WITH STERIOD INJECTION Left 02/06/2017   Procedure: Closed MANIPULATION of knee with Steroid Injection;  Surgeon: Loreta Ave, MD;  Location: [facility];  Service: Orthopedics;  Laterality: Left;   COLONOSCOPY  04/04/2020   Dr. Barron Alvine; 5 tubular adenomas in the sigmoid and ascending colon, multiple hyperplastic polyp in the rectum, diverticulosis in the sigmoid colon, nonbleeding internal hemorrhoids.  Repeat in 3 years.   ESOPHAGOGASTRODUODENOSCOPY (EGD) WITH PROPOFOL N/A 09/17/2021   Procedure: ESOPHAGOGASTRODUODENOSCOPY (EGD) WITH PROPOFOL;  Surgeon: Lanelle Bal, DO;  Location: [facility];  Service: Endoscopy;  Laterality: N/A;  7:30am   HERNIA REPAIR     umbilical  JOINT REPLACEMENT Left 06/26/2016   Dr Eulah Pont   KNEE ARTHROSCOPY WITH LATERAL RELEASE Left 08/03/2015   Procedure: LEFT KNEE ARTHROSCOPY  CHONDROPLASTY WITH LATERAL RELEASE;  Surgeon: Loreta Ave, MD;  Location: [facility];  Service: Orthopedics;  Laterality: Left;   TOTAL KNEE ARTHROPLASTY Left 06/26/2016   Procedure: TOTAL KNEE ARTHROPLASTY;  Surgeon: Loreta Ave, MD;  Location: [facility];  Service: Orthopedics;  Laterality: Left;    Current Outpatient Medications  Medication Sig Dispense Refill   atenolol (TENORMIN) 25 MG tablet Take 1 tablet (25 mg  total) by mouth daily. 90 tablet 1   carboxymethylcellulose (REFRESH PLUS) 0.5 % SOLN Place 1 drop into both eyes 3 (three) times daily as needed (dry eye).     chlorthalidone (HYGROTON) 50 MG tablet Take 1 tablet (50 mg total) by mouth daily. 90 tablet 1   clobetasol ointment (TEMOVATE) 0.05 % Apply thin film to external vulvar area nightly for 1 month, then switch to every other night for 2 weeks, then decrease use to 2 nights per week only. Wash hands after use 60 g 1   estradiol (CLIMARA) 0.05 mg/24hr patch Place 1 patch (0.05 mg total) onto the skin once a week. 4 patch 12   fluticasone (FLONASE) 50 MCG/ACT nasal spray Place 2 sprays into both nostrils daily as needed for rhinitis.     indomethacin (INDOCIN) 50 MG capsule Take 50 mg by mouth daily as needed (gout).     lidocaine (LIDODERM) 5 % Place 1 patch onto the skin daily as needed. Remove & Discard patch within 12 hours or as directed by MD 30 patch 6   MENTHOL-METHYL SALICYLATE EX Apply topically as needed. Apply small amount to affected area four times daily.     metoCLOPramide (REGLAN) 5 MG tablet Take 1 tablet (5 mg total) by mouth every 8 (eight) hours as needed for nausea or vomiting. 15 tablet 0   Multiple Vitamin (MULTIVITAMIN ADULT PO) Take by mouth as needed.     pantoprazole (PROTONIX) 40 MG tablet Take 1 tablet (40 mg total) by mouth daily. 30 tablet 11   polyethylene glycol (MIRALAX / GLYCOLAX) 17 g packet Take 17 g by mouth daily as needed.     psyllium (METAMUCIL) 58.6 % packet Take 1 packet by mouth daily as needed.     SUMAtriptan (IMITREX) 100 MG tablet Take 100 mg by mouth every 2 (two) hours as needed for migraine. First dose at onset of headache, then repeat in 2 hours PRN.     diltiazem (CARDIZEM CD) 120 MG 24 hr capsule Take 1 capsule (120 mg total) by mouth daily. (Patient not taking: Reported on 10/23/2023) 90 capsule 0   PRAZOSIN HCL PO Take 2 mg by mouth daily at 6 (six) AM. (Patient not taking: Reported on  10/23/2023)     Rimegepant Sulfate 75 MG TBDP Take 1 tablet by mouth as needed. (Patient not taking: Reported on 10/23/2023)     sertraline (ZOLOFT) 100 MG tablet Take 500 mg by mouth daily as needed. (Patient not taking: Reported on 10/23/2023)     tiZANidine (ZANAFLEX) 4 MG capsule Take 4 mg by mouth 3 (three) times daily as needed. (Patient not taking: Reported on 10/23/2023)     traZODone (DESYREL) 50 MG tablet Take 50 mg by mouth at bedtime as needed. (Patient not taking: Reported on 10/23/2023)     No current facility-administered medications for this visit.    Allergies as of 10/23/2023 - Review  Complete 10/23/2023  Allergen Reaction Noted   No known allergies  06/25/2016   Metoprolol Rash 03/01/2010    Family History  Problem Relation Age of Onset   Hypertension Mother    Thyroid disease Mother        Had surgery, unknown diagnosis   Diabetes Father    Hypertension Father    Heart disease Father        chf   Thyroid disease Sister    Hypertension Brother    Stomach cancer Brother    Gastric cancer Brother    Colon cancer Maternal Grandmother    Stomach cancer Maternal Grandmother    Gastric cancer Maternal Grandmother    Cancer Maternal Grandfather        colon   Cancer Paternal Grandmother        cervical or colon????   Cancer Cousin        prostate   Thyroid cancer Neg Hx    Colon polyps Neg Hx    Esophageal cancer Neg Hx     Social History   Socioeconomic History   Marital status: Married    Spouse name: Not on file   Number of children: Not on file   Years of education: Not on file   Highest education level: Not on file  Occupational History   Not on file  Tobacco Use   Smoking status: Never   Smokeless tobacco: Never  Vaping Use   Vaping status: Never Used  Substance and Sexual Activity   Alcohol use: Not Currently   Drug use: No   Sexual activity: Yes    Birth control/protection: Surgical  Other Topics Concern   Not on file  Social History  Narrative   Not on file   Social Determinants of Health   Financial Resource Strain: Not on file  Food Insecurity: No Food Insecurity (04/17/2022)   Received from [facility], [facility]   Hunger Vital Sign    Worried About Running Out of Food in the Last Year: Never true    Ran Out of Food in the Last Year: Never true  Transportation Needs: Not on file  Physical Activity: Not on file  Stress: Not on file  Social Connections: Unknown (04/04/2022)   Received from [facility], [facility]   Social Network    Social Network: Not on file    Subjective: Review of Systems  Constitutional:  Negative for chills and fever.  HENT:  Negative for congestion and hearing loss.   Eyes:  Negative for blurred vision and double vision.  Respiratory:  Negative for cough and shortness of breath.   Cardiovascular:  Negative for chest pain and palpitations.  Gastrointestinal:  Positive for constipation and heartburn. Negative for abdominal pain, blood in stool, diarrhea, melena and vomiting.       Bloating  Genitourinary:  Negative for dysuria and urgency.  Musculoskeletal:  Negative for joint pain and myalgias.  Skin:  Negative for itching and rash.  Neurological:  Negative for dizziness and headaches.  Psychiatric/Behavioral:  Negative for depression. The patient is not nervous/anxious.      Objective: BP (!) 135/97   Pulse 86   Temp 98.6 F (37 C)   Ht 5\' 4"  (1.626 m)   Wt 184 lb 3.2 oz (83.6 kg)   BMI 31.62 kg/m  Physical Exam Constitutional:      Appearance: Normal appearance.  HENT:     Head: Normocephalic and atraumatic.  Eyes:     Extraocular Movements:  Extraocular movements intact.     Conjunctiva/sclera: Conjunctivae normal.  Cardiovascular:     Rate and Rhythm: Normal rate and regular rhythm.  Pulmonary:     Effort: Pulmonary effort is normal.     Breath sounds: Normal breath sounds.  Abdominal:     General: Bowel sounds are normal.     Palpations: Abdomen is  soft.  Musculoskeletal:        General: No swelling. Normal range of motion.     Cervical back: Normal range of motion and neck supple.  Skin:    General: Skin is warm and dry.     Coloration: Skin is not jaundiced.  Neurological:     General: No focal deficit present.     Mental Status: She is alert and oriented to person, place, and time.  Psychiatric:        Mood and Affect: Mood normal.        Behavior: Behavior normal.      Assessment/Plan:  1.  Abdominal pain, obstipation, IBS-C-patient has not had a BM in over a week.  Nausea vomiting have resolved.  CT reviewed with patient today.  Moderate stool burden on my personal view on the right side of the colon transverse colon.  Dilated stomach.    Instructions given for enema with MiraLAX purge today.    When she has adequate bowel movement, would resume on Linzess.  290 mcg was too strong for her.  Will start on 72 mcg daily (samples provided today).  Patient to call with update next week and I can send in formal prescription.  2.  Chronic GERD-well-controlled pantoprazole daily will continue  Follow-up in 4 to 6 weeks  10/23/2023 8:41 AM   Disclaimer: This note was dictated with voice recognition software. Similar sounding words can inadvertently be transcribed and may not be corrected upon review.

## 2023-11-11 ENCOUNTER — Telehealth: Payer: Self-pay

## 2023-11-11 NOTE — Telephone Encounter (Signed)
Returned the pt's call and was advised that the [place] told her that they are not going to cover the Linzess for Rx. So the pt went through her PCP and she is now being prescribed Plecanatide (she has not picked it up yet, it is a mail order) and also her PCP wanted her to let us know that the pt had drank a few sips of eggnog (not spiked) and now she has to keep clearing her throat. Her PCP thinks it is a allergy to eggnog. Pt advised me that she is calling her allergist to advised them of this same thing. She states that all of this is a Financial planner for our records

## 2023-11-12 NOTE — Telephone Encounter (Signed)
Thank you for letting me know

## 2023-11-13 NOTE — Telephone Encounter (Signed)
noted 

## 2023-11-14 ENCOUNTER — Encounter: Payer: Self-pay | Admitting: Physical Medicine & Rehabilitation

## 2023-12-05 ENCOUNTER — Encounter: Payer: Non-veteran care | Admitting: Physical Medicine & Rehabilitation

## 2023-12-22 ENCOUNTER — Encounter: Payer: Self-pay | Admitting: Physical Medicine & Rehabilitation

## 2023-12-22 ENCOUNTER — Encounter
Payer: No Typology Code available for payment source | Attending: Physical Medicine & Rehabilitation | Admitting: Physical Medicine & Rehabilitation

## 2023-12-22 VITALS — BP 130/91 | HR 90 | Ht 64.0 in | Wt 182.0 lb

## 2023-12-22 DIAGNOSIS — M25562 Pain in left knee: Secondary | ICD-10-CM | POA: Diagnosis present

## 2023-12-22 DIAGNOSIS — N189 Chronic kidney disease, unspecified: Secondary | ICD-10-CM | POA: Insufficient documentation

## 2023-12-22 DIAGNOSIS — M2550 Pain in unspecified joint: Secondary | ICD-10-CM | POA: Diagnosis not present

## 2023-12-22 DIAGNOSIS — M25552 Pain in left hip: Secondary | ICD-10-CM | POA: Insufficient documentation

## 2023-12-22 DIAGNOSIS — G8929 Other chronic pain: Secondary | ICD-10-CM | POA: Diagnosis present

## 2023-12-22 MED ORDER — DULOXETINE HCL 30 MG PO CPEP: 30.0000 mg | ORAL_CAPSULE | Freq: Every day | ORAL | 3 refills | Status: DC

## 2023-12-22 NOTE — Progress Notes (Signed)
Subjective:    Patient ID: Rhonda Gibbs, female    DOB: Apr 27, 1971, 53 y.o.   MRN: [id number]  HPI  HPI  Rhonda Gibbs is a 53 y.o. year old female  who  has a past medical history of Anemia, Anxiety, Arthritis, Cataract, Depression, High cholesterol, Hypertension, Migraine, Myocardial infarction [facility]), Neuromuscular disorder (HCC), OSA (obstructive sleep apnea), Osteoporosis, Sleep apnea, and Thyroid disease.   They are presenting to PM&R clinic as a new patient for pain management evaluation. They were referred by Vinnie Level MD for treatment of L hip pain pain.  Patient reports her pain is related to her left TKA history.  She had her left TKA in 2017 at an outside facility and later had a revision 11/03/2017 by Dr. 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  Patient reports having persistent pain and stiffness in her knee and reports worsening range of motion since this time.  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  The worst pain is in her anterior knee and this is increased by any kind of ambulation or movement.  She reports she has trouble extending her knee fully.  Patient reports she is scheduled to see her surgeon next week follow-up on her knee issues.  Patient reports she developed left hip pain about a year or 2 ago.  She reports that she had a fall on her right side around this time and then the left hip pain developed a few months later.  She describes primarily in her left groin region worsened with ambulation.  She says it hurts all the time and sometimes will lock up relatively bad where she has a hard time moving her leg.  She additionally reports other areas of pain such as chronic plantar fasciitis chronic lower back pain due to bulging disc.  She reports having osteoarthritis down her whole spine.  Patient also indicates that she has episodes of pain throughout her entire body.  She reports her arms, legs, back, neck,  shoulders will hurt at various time.  Patient denies depression.  She has a history of IBS.  She also has a history of sleep disorder and insomnia.  She reports OSA and uses her CPAP regularly.    Red flag symptoms: No red flags for back pain endorsed in Hx or ROS  Medications tried: Topical medications Denies  Nsaids - Denies benefit  Tylenol - Denies benefit  Opiates  Diluadid after her surgery in 2018 Buprenorphine?  She says this did not help in the past years ago?   [facility] indicates trying buprenorphine was discussed but she does not want to use any opioid type medications. Gabapentin - Denies benefit  TCAs  - Denies benefit  SNRIs  Denies    Other treatments: PT- Denies recent, reports this did not help in the past Chiropractor-did not help TENs unit - She has one at home, denies benefit  Injections - I hate needles, has not had any injections  Surgery - Denies    Prior UDS results: No results found for: "LABOPIA", "COCAINSCRNUR", "LABBENZ", "AMPHETMU", "THCU", "LABBARB"    Pain Inventory Average Pain 8 Pain Right Now 7 My pain is constant, sharp, burning, dull, stabbing, tingling, and aching  In the last 24 hours, has pain interfered with the following? General activity 9 Relation with others 9 Enjoyment of life 9 What TIME of day is your pain at its worst? morning , daytime, evening, and night  Sleep (in general) Poor  Pain is worse with: walking, bending, sitting, standing, and some activites Pain improves with:  no relief at the moment   Family History  Problem Relation Age of Onset   Hypertension Mother    Thyroid disease Mother        Had surgery, unknown diagnosis   Diabetes Father    Hypertension Father    Heart disease Father        chf   Thyroid disease Sister    Hypertension Brother    Stomach cancer Brother    Gastric cancer Brother    Colon cancer Maternal Grandmother    Stomach cancer Maternal Grandmother     Gastric cancer Maternal Grandmother    Cancer Maternal Grandfather        colon   Cancer Paternal Grandmother        cervical or colon????   Cancer Cousin        prostate   Thyroid cancer Neg Hx    Colon polyps Neg Hx    Esophageal cancer Neg Hx    Social History   Socioeconomic History   Marital status: Married    Spouse name: Not on file   Number of children: Not on file   Years of education: Not on file   Highest education level: Not on file  Occupational History   Not on file  Tobacco Use   Smoking status: Never   Smokeless tobacco: Never  Vaping Use   Vaping status: Never Used  Substance and Sexual Activity   Alcohol use: Not Currently   Drug use: No   Sexual activity: Yes    Birth control/protection: Surgical  Other Topics Concern   Not on file  Social History Narrative   Not on file   Social Drivers of Health   Financial Resource Strain: Not on file  Food Insecurity: No Food Insecurity (04/17/2022)   Received from [facility], [facility]   Hunger Vital Sign    Worried About Running Out of Food in the Last Year: Never true    Ran Out of Food in the Last Year: Never true  Transportation Needs: Not on file  Physical Activity: Not on file  Stress: Not on file  Social Connections: Unknown (04/04/2022)   Received from [facility], [facility]   Social Network    Social Network: Not on file   Past Surgical History:  Procedure Laterality Date   ABDOMINAL HYSTERECTOMY     partial   BALLOON DILATION N/A 09/17/2021   Procedure: BALLOON DILATION;  Surgeon: Lanelle Bal, DO;  [facility];  Service: Endoscopy;  Laterality: N/A;   BIOPSY  09/17/2021   Procedure: BIOPSY;  Surgeon: Lanelle Bal, DO;  [facility];  Service: Endoscopy;;   CHONDROPLASTY Left 08/03/2015   Procedure: CHONDROPLASTY;  Surgeon: Loreta Ave, MD;  Location: [facility];  Service: Orthopedics;  Laterality: Left;   CLOSED MANIPULATION  KNEE WITH STERIOD INJECTION Left 02/06/2017   Procedure: Closed MANIPULATION of knee with Steroid Injection;  Surgeon: Loreta Ave, MD;  Location: [facility];  Service: Orthopedics;  Laterality: Left;   COLONOSCOPY  04/04/2020   Dr. Barron Alvine; 5 tubular adenomas in the sigmoid and ascending colon, multiple hyperplastic polyp in the rectum, diverticulosis in the sigmoid colon, nonbleeding internal hemorrhoids.  Repeat in 3 years.   ESOPHAGOGASTRODUODENOSCOPY (EGD) WITH PROPOFOL N/A 09/17/2021   Procedure: ESOPHAGOGASTRODUODENOSCOPY (EGD) WITH PROPOFOL;  Surgeon: Earnest Bailey  K, DO;  [facility];  Service: Endoscopy;  Laterality: N/A;  7:30am   HERNIA REPAIR     umbilical   JOINT REPLACEMENT Left 06/26/2016   Dr Eulah Pont   KNEE ARTHROSCOPY WITH LATERAL RELEASE Left 08/03/2015   Procedure: LEFT KNEE ARTHROSCOPY  CHONDROPLASTY WITH LATERAL RELEASE;  Surgeon: Loreta Ave, MD;  Location: [facility];  Service: Orthopedics;  Laterality: Left;   TOTAL KNEE ARTHROPLASTY Left 06/26/2016   Procedure: TOTAL KNEE ARTHROPLASTY;  Surgeon: Loreta Ave, MD;  Location: [facility];  Service: Orthopedics;  Laterality: Left;   Past Surgical History:  Procedure Laterality Date   ABDOMINAL HYSTERECTOMY     partial   BALLOON DILATION N/A 09/17/2021   Procedure: BALLOON DILATION;  Surgeon: Lanelle Bal, DO;  [facility];  Service: Endoscopy;  Laterality: N/A;   BIOPSY  09/17/2021   Procedure: BIOPSY;  Surgeon: Lanelle Bal, DO;  [facility];  Service: Endoscopy;;   CHONDROPLASTY Left 08/03/2015   Procedure: CHONDROPLASTY;  Surgeon: Loreta Ave, MD;  Location: [facility];  Service: Orthopedics;  Laterality: Left;   CLOSED MANIPULATION KNEE WITH STERIOD INJECTION Left 02/06/2017   Procedure: Closed MANIPULATION of knee with Steroid Injection;  Surgeon: Loreta Ave, MD;  Location: [facility];   Service: Orthopedics;  Laterality: Left;   COLONOSCOPY  04/04/2020   Dr. Barron Alvine; 5 tubular adenomas in the sigmoid and ascending colon, multiple hyperplastic polyp in the rectum, diverticulosis in the sigmoid colon, nonbleeding internal hemorrhoids.  Repeat in 3 years.   ESOPHAGOGASTRODUODENOSCOPY (EGD) WITH PROPOFOL N/A 09/17/2021   Procedure: ESOPHAGOGASTRODUODENOSCOPY (EGD) WITH PROPOFOL;  Surgeon: Lanelle Bal, DO;  [facility];  Service: Endoscopy;  Laterality: N/A;  7:30am   HERNIA REPAIR     umbilical   JOINT REPLACEMENT Left 06/26/2016   Dr Eulah Pont   KNEE ARTHROSCOPY WITH LATERAL RELEASE Left 08/03/2015   Procedure: LEFT KNEE ARTHROSCOPY  CHONDROPLASTY WITH LATERAL RELEASE;  Surgeon: Loreta Ave, MD;  Location: [facility];  Service: Orthopedics;  Laterality: Left;   TOTAL KNEE ARTHROPLASTY Left 06/26/2016   Procedure: TOTAL KNEE ARTHROPLASTY;  Surgeon: Loreta Ave, MD;  Location: [facility];  Service: Orthopedics;  Laterality: Left;   Past Medical History:  Diagnosis Date   Anemia    Anxiety    Arthritis    Cataract    Depression    High cholesterol    Hypertension    Migraine    Myocardial infarction [facility])    years ago   Neuromuscular disorder (HCC)    drop foot on left, numbness in left foot and left side of knee   OSA (obstructive sleep apnea)    states she has never used CPAP. "borderline"   Osteoporosis    Sleep apnea    has a cpap but does not use   Thyroid disease    Ht 5\' 4"  (1.626 m)   BMI 31.62 kg/m   Opioid Risk Score:   Fall Risk Score:  `1  Depression screen [facility] 2/9     12/22/2023    2:51 PM  Depression screen PHQ 2/9  Decreased Interest 0  Down, Depressed, Hopeless 0  PHQ - 2 Score 0  Altered sleeping 0  Tired, decreased energy 0  Change in appetite 0  Feeling bad or failure about yourself  0  Trouble concentrating 0  Moving slowly or fidgety/restless 0  Suicidal thoughts 0  PHQ-9 Score 0      Review of Systems  Musculoskeletal:  Positive for gait problem.       B/l hip pains   All other systems reviewed and are negative.     Objective:   Physical Exam  Gen: no distress, normal appearing patient sitting with exam table elevated so that her legs could dangle as this is more comfortable HEENT: oral mucosa pink and moist, NCAT Chest: normal effort, normal rate of breathing Abd: soft, non-distended Ext: no edema Psych: Operative and appropriate, but affect was flat Skin: intact Neuro: Alert and awake, follows commands, cranial nerves II through XII grossly intact, normal speech and language RUE: 5/5 Deltoid, 5/5 Biceps, 5/5 Triceps, 5/5 Wrist Ext, 5/5 Grip LUE: 5/5 Deltoid, 5/5 Biceps, 5/5 Triceps, 5/5 Wrist Ext, 5/5 Grip RLE: HF 5/5, KE 5/5, ADF 5/5, APF 5/5 LLE: HF 3/5, KE 3/5, ADF 4+/5, APF 4+/5 Sensory exam normal for light touch and pain in all 4 limbs. No limb ataxia or cerebellar signs. No abnormal tone appreciated.  No abnormal tone noted Musculoskeletal:  TTP noted C-spine paraspinal muscles No TTP L-spine paraspinal muscles She does have TTP right QL muscle TTP bilateral plantar feet Very TTP throughout L-spine-guarding No significant TTP greater trochanter Mild TTP left elbow She has not appear to have diffuse bilateral upper and lower extremity tenderness today-although reports pain throughout at various times Some test negative bilaterally Pain reported with left hip and left knee P ROM however exam very limited because patient declined any significant movements to these joints When asked patient to fully extend her knee she extended it initially to about 10 degrees, however later in the visit it appeared she was able to get the knee to about 5 degrees Very antalgic gait and walks with a cane     Assessment:  1) Chronic left knee pain with history of TKA and arthrofibrosis and joint stiffness 2) Left hip pain.  -Suspect her left hip and right QL pain  is related to her altered gait from her left knee pain. 3) Hx of CKD reported, [facility]rolog[facility], her creatinine is 0.70 on 10/16/2023 4)PMH of Depression noted in chart although she denied feeling depressed.  Her affect did appear flat today. 5) Polyarthralgia  -Patient has body wide pain poorly explained by other conditions.  She does have a history of depression, IBS, sleep disorder.  Suspect that she may have fibromyalgia.   [facility] records reviewed.  08/25/2023 x-ray left hip does not indicate significant abnormality. 08/25/2023 x-ray of the knee shows TKA without any significant fracture or loosening. 08/25/2023 x-ray of the ankle shows unchanged Achilles calcaneal enthesophyte  04/21/2023 x-ray of lumbar spine indicates vertebral bodies are normal height and alignment.  No acute abnormalities.  Disc spaces are preserved.  04/21/2023 x-ray right knee indicated mild medial compartment joint space narrowing with small osteophyte fights present   Plan:   -Continue follow-up with orthopedics, she has appointment with her orthopedic provider at [facility] next week -Patient declines any kind of injection today.  If he changes in mind could consider cortisone injection for the hip, genicular nerve RFA for left knee -Start Cymbalta 30 mg daily, discussed possible risks and benefits -She would like to avoid opioid medications -Food for pain discussed,list provided -Patient declines PT at this time, she reports it previously did not help -She reports poor benefit with gabapentin, could try Lyrica -Consider trying muscle relaxer

## 2024-05-27 ENCOUNTER — Encounter (INDEPENDENT_AMBULATORY_CARE_PROVIDER_SITE_OTHER): Payer: Self-pay | Admitting: *Deleted

## 2024-07-01 ENCOUNTER — Telehealth: Payer: Self-pay

## 2024-07-01 NOTE — Telephone Encounter (Signed)
 Who is your primary care physician: Josette Irving  Reasons for the colonoscopy: HX polyps  Have you had a colonoscopy before?  Yes 04/04/2020 Vito Cirigliano  Do you have family history of colon cancer? Yes Grandmother  Previous colonoscopy with polyps removed? yes  Do you have a history colorectal cancer?   no  Are you diabetic? If yes, Type 1 or Type 2?    no  Do you have a prosthetic or mechanical heart valve? no  Do you have a pacemaker/defibrillator?   no  Have you had endocarditis/atrial fibrillation? no  Have you had joint replacement within the last 12 months?  no  Do you tend to be constipated or have to use laxatives? no  Do you have any history of drugs or alchohol?  no  Do you use supplemental oxygen?  no  Have you had a stroke or heart attack within the last 6 months? no  Do you take weight loss medication?  no  For female patients: have you had a hysterectomy?  yes                                     are you post menopausal?       no                                            do you still have your menstrual cycle? no      Do you take any blood-thinning medications such as: (aspirin , warfarin, Plavix, Aggrenox)  no  If yes we need the name, milligram, dosage and who is prescribing doctor  Current Outpatient Medications on File Prior to Visit  Medication Sig Dispense Refill   atenolol  (TENORMIN ) 25 MG tablet Take 1 tablet (25 mg total) by mouth daily. 90 tablet 1   chlorthalidone  (HYGROTON ) 50 MG tablet Take 1 tablet (50 mg total) by mouth daily. 90 tablet 1   spironolactone  (ALDACTONE ) 50 MG tablet Take 50 mg by mouth daily.     No current facility-administered medications on file prior to visit.    Allergies  Allergen Reactions   No Known Allergies    Metoprolol Rash     Pharmacy: [facility]  Primary Insurance Name: [employer] [id number]  Best number where you can be reached: [phone]

## 2024-08-10 ENCOUNTER — Other Ambulatory Visit: Payer: Self-pay | Admitting: *Deleted

## 2024-08-10 ENCOUNTER — Encounter: Payer: Self-pay | Admitting: *Deleted

## 2024-08-10 MED ORDER — PEG 3350-KCL-NA BICARB-NACL 420 G PO SOLR
4000.0000 mL | Freq: Once | ORAL | 0 refills | Status: AC
Start: 2024-08-10 — End: 2024-08-10

## 2024-08-10 NOTE — Telephone Encounter (Signed)
 Pt has been scheduled for 08/26/24 with Dr.Carver. instructions mailed and prep sent to pharmacy.

## 2024-08-10 NOTE — Telephone Encounter (Signed)
 Reported hysterectomy. She is on diuretic, bmet if needed per protocol [facility] for colonoscopy. ASA 2.

## 2024-08-11 ENCOUNTER — Encounter (INDEPENDENT_AMBULATORY_CARE_PROVIDER_SITE_OTHER): Payer: Self-pay | Admitting: *Deleted

## 2024-08-11 NOTE — Telephone Encounter (Signed)
 Referral completed, TCS apt letter sent to PCP

## 2024-08-11 NOTE — Telephone Encounter (Signed)
 Pt wanted prep sent to [facility] pharmacy.

## 2024-08-26 ENCOUNTER — Encounter (HOSPITAL_COMMUNITY)
Admission: RE | Disposition: A | Discharge status: Home / Self Care | Payer: Self-pay | Source: Home / Self Care | Attending: Internal Medicine

## 2024-08-26 ENCOUNTER — Encounter (HOSPITAL_COMMUNITY): Payer: Self-pay | Admitting: Internal Medicine

## 2024-08-26 ENCOUNTER — Ambulatory Visit (HOSPITAL_COMMUNITY): Admitting: Certified Registered"

## 2024-08-26 ENCOUNTER — Other Ambulatory Visit: Payer: Self-pay

## 2024-08-26 ENCOUNTER — Ambulatory Visit (HOSPITAL_COMMUNITY)
Admission: RE | Admit: 2024-08-26 | Discharge: 2024-08-26 | Disposition: A | Discharge status: Home / Self Care | Attending: Internal Medicine | Admitting: Internal Medicine

## 2024-08-26 ENCOUNTER — Ambulatory Visit (HOSPITAL_BASED_OUTPATIENT_CLINIC_OR_DEPARTMENT_OTHER): Admitting: Certified Registered"

## 2024-08-26 DIAGNOSIS — N189 Chronic kidney disease, unspecified: Secondary | ICD-10-CM | POA: Diagnosis not present

## 2024-08-26 DIAGNOSIS — I129 Hypertensive chronic kidney disease with stage 1 through stage 4 chronic kidney disease, or unspecified chronic kidney disease: Secondary | ICD-10-CM | POA: Diagnosis not present

## 2024-08-26 DIAGNOSIS — I252 Old myocardial infarction: Secondary | ICD-10-CM | POA: Diagnosis not present

## 2024-08-26 DIAGNOSIS — G4730 Sleep apnea, unspecified: Secondary | ICD-10-CM | POA: Insufficient documentation

## 2024-08-26 DIAGNOSIS — Z1211 Encounter for screening for malignant neoplasm of colon: Secondary | ICD-10-CM

## 2024-08-26 DIAGNOSIS — Z860101 Personal history of adenomatous and serrated colon polyps: Secondary | ICD-10-CM

## 2024-08-26 DIAGNOSIS — K5730 Diverticulosis of large intestine without perforation or abscess without bleeding: Secondary | ICD-10-CM | POA: Diagnosis not present

## 2024-08-26 DIAGNOSIS — D123 Benign neoplasm of transverse colon: Secondary | ICD-10-CM

## 2024-08-26 DIAGNOSIS — I10 Essential (primary) hypertension: Secondary | ICD-10-CM | POA: Diagnosis not present

## 2024-08-26 DIAGNOSIS — Z800 Family history of malignant neoplasm of digestive organs: Secondary | ICD-10-CM | POA: Diagnosis not present

## 2024-08-26 DIAGNOSIS — K635 Polyp of colon: Secondary | ICD-10-CM | POA: Diagnosis not present

## 2024-08-26 DIAGNOSIS — F418 Other specified anxiety disorders: Secondary | ICD-10-CM

## 2024-08-26 DIAGNOSIS — K648 Other hemorrhoids: Secondary | ICD-10-CM | POA: Diagnosis not present

## 2024-08-26 DIAGNOSIS — K562 Volvulus: Secondary | ICD-10-CM

## 2024-08-26 HISTORY — PX: COLONOSCOPY: SHX5424

## 2024-08-26 HISTORY — DX: Chronic kidney disease, unspecified: N18.9

## 2024-08-26 SURGERY — COLONOSCOPY
Anesthesia: General

## 2024-08-26 MED ORDER — PROPOFOL 10 MG/ML IV BOLUS
INTRAVENOUS | Status: DC | PRN
  Administered 2024-08-26: 50 mg via INTRAVENOUS
  Administered 2024-08-26: 100 mg via INTRAVENOUS
  Administered 2024-08-26 (×9): 50 mg via INTRAVENOUS

## 2024-08-26 MED ORDER — LIDOCAINE HCL (CARDIAC) PF 100 MG/5ML IV SOSY
PREFILLED_SYRINGE | INTRAVENOUS | Status: DC | PRN
  Administered 2024-08-26: 50 mg via INTRAVENOUS

## 2024-08-26 MED ORDER — LACTATED RINGERS IV SOLN: INTRAVENOUS | Status: DC

## 2024-08-26 NOTE — Anesthesia Preprocedure Evaluation (Signed)
 Anesthesia Evaluation  Patient identified by MRN, date of birth, ID band Patient awake    Reviewed: Allergy & Precautions, H&P , NPO status , Patient's Chart, lab work & pertinent test results, reviewed documented beta blocker date and time   Airway Mallampati: II  TM Distance: >3 FB Neck ROM: full    Dental no notable dental hx.    Pulmonary sleep apnea    Pulmonary exam normal breath sounds clear to auscultation       Cardiovascular Exercise Tolerance: Good hypertension, + Past MI   Rhythm:regular Rate:Normal     Neuro/Psych  Headaches PSYCHIATRIC DISORDERS Anxiety Depression     Neuromuscular disease    GI/Hepatic negative GI ROS, Neg liver ROS,,,  Endo/Other  negative endocrine ROS    Renal/GU Renal disease  negative genitourinary   Musculoskeletal   Abdominal   Peds  Hematology  (+) Blood dyscrasia, anemia   Anesthesia Other Findings   Reproductive/Obstetrics negative OB ROS                              Anesthesia Physical Anesthesia Plan  ASA: 3  Anesthesia Plan: General   Post-op Pain Management:    Induction:   PONV Risk Score and Plan: Propofol  infusion  Airway Management Planned:   Additional Equipment:   Intra-op Plan:   Post-operative Plan:   Informed Consent: I have reviewed the patients History and Physical, chart, labs and discussed the procedure including the risks, benefits and alternatives for the proposed anesthesia with the patient or authorized representative who has indicated his/her understanding and acceptance.     Dental Advisory Given  Plan Discussed with: CRNA  Anesthesia Plan Comments:         Anesthesia Quick Evaluation

## 2024-08-26 NOTE — Anesthesia Postprocedure Evaluation (Signed)
 Anesthesia Post Note  Patient: Rhonda Gibbs  Procedure(s) Performed: COLONOSCOPY  Patient location during evaluation: Phase II Anesthesia Type: General Level of consciousness: awake Pain management: pain level controlled Vital Signs Assessment: post-procedure vital signs reviewed and stable Respiratory status: spontaneous breathing and respiratory function stable Cardiovascular status: blood pressure returned to baseline and stable Postop Assessment: no headache and no apparent nausea or vomiting Anesthetic complications: no Comments: Late entry   No notable events documented.   Last Vitals:  Vitals:   08/26/24 0650 08/26/24 0803  BP: (!) 177/102 131/82  Pulse: 90 97  Resp: 15 14  Temp: 36.7 C 36.4 C  SpO2: 97% 97%    Last Pain:  Vitals:   08/26/24 0803  TempSrc: Oral  PainSc: 0-No pain                 Yvonna JINNY Bosworth

## 2024-08-26 NOTE — Op Note (Signed)
 [facility] Patient Name: Rhonda Gibbs Procedure Date: 08/26/2024 7:06 AM MRN: [id number] Date of Birth: March 09, 1971 Attending MD: Carlin POUR. Cindie , [place], [id number] CSN: [id number] Age: 53 Admit Type: Outpatient Procedure:                Colonoscopy Indications:              Surveillance: Personal history of adenomatous                            polyps on last colonoscopy > 3 years ago Providers:                Carlin POUR. Cindie, DO, Leandrew Edelman RN, RN, Jon Loge Referring MD:              Medicines:                See the Anesthesia note for documentation of the                            administered medications Complications:            No immediate complications. Estimated Blood Loss:     Estimated blood loss was minimal. Procedure:                Pre-Anesthesia Assessment:                           - The anesthesia plan was to use monitored                            anesthesia care (MAC).                           After obtaining informed consent, the colonoscope                            was passed under direct vision. Throughout the                            procedure, the patient's blood pressure, pulse, and                            oxygen saturations were monitored continuously. The                            [id number]-HQ190L ([id number]) Peds Colon was introduced                            through the anus and advanced to the the cecum,                            identified by appendiceal orifice and ileocecal                            valve. The colonoscopy was  somewhat difficult due                            to a redundant colon and significant looping.                            Successful completion of the procedure was aided by                            applying abdominal pressure. The patient tolerated                            the procedure well. The quality of the bowel                            preparation was evaluated  using the BBPS [facility]                            Bowel Preparation Scale) with scores of: Right                            Colon = 3, Transverse Colon = 3 and Left Colon = 3                            (entire mucosa seen well with no residual staining,                            small fragments of stool or opaque liquid). The                            total BBPS score equals 9. Scope In: 7:32:37 AM Scope Out: 8:00:08 AM Scope Withdrawal Time: 0 hours 17 minutes 31 seconds  Total Procedure Duration: 0 hours 27 minutes 31 seconds  Findings:      Non-bleeding internal hemorrhoids were found.      A few small-mouthed diverticula were found in the sigmoid colon.      A 7 mm polyp was found in the transverse colon. The polyp was       pedunculated. The polyp was removed with a cold snare. Resection and       retrieval were complete.      A 3 mm polyp was found in the sigmoid colon. The polyp was sessile. The       polyp was removed with a cold snare. Resection and retrieval were       complete.      The exam was otherwise without abnormality. Impression:               - Non-bleeding internal hemorrhoids.                           - Diverticulosis in the sigmoid colon.                           - One 7 mm polyp in the transverse colon, removed  with a cold snare. Resected and retrieved.                           - One 3 mm polyp in the sigmoid colon, removed with                            a cold snare. Resected and retrieved.                           - The examination was otherwise normal. Moderate Sedation:      Per Anesthesia Care Recommendation:           - Patient has a contact number available for                            emergencies. The signs and symptoms of potential                            delayed complications were discussed with the                            patient. Return to normal activities tomorrow.                            Written  discharge instructions were provided to the                            patient.                           - Resume previous diet.                           - Continue present medications.                           - Await pathology results.                           - Repeat colonoscopy in 5 years for surveillance                            and family history of CRC in grandmother,                            grandfather, cousin                           - Return to GI clinic PRN. Procedure Code(s):        --- Professional ---                           [phone], Colonoscopy, flexible; with removal of                            tumor(s), polyp(s), or other lesion(s) by snare  technique Diagnosis Code(s):        --- Professional ---                           Z86.010, Personal history of colonic polyps                           K64.8, Other hemorrhoids                           D12.3, Benign neoplasm of transverse colon (hepatic                            flexure or splenic flexure)                           D12.5, Benign neoplasm of sigmoid colon                           K57.30, Diverticulosis of large intestine without                            perforation or abscess without bleeding CPT copyright 2022 American Medical Association. All rights reserved. The codes documented in this report are preliminary and upon coder review may  be revised to meet current compliance requirements. Carlin POUR. Cindie, DO Carlin POUR. Cindie, DO 08/26/2024 8:08:37 AM This report has been signed electronically. Number of Addenda: 0

## 2024-08-26 NOTE — Transfer of Care (Signed)
 Immediate Anesthesia Transfer of Care Note  Patient: Rhonda Gibbs  Procedure(s) Performed: COLONOSCOPY  Patient Location: Endoscopy Unit  Anesthesia Type:General  Level of Consciousness: awake, alert , oriented, patient cooperative, and responds to stimulation  Airway & Oxygen Therapy: Patient Spontanous Breathing  Post-op Assessment: Report given to RN and Post -op Vital signs reviewed and stable  Post vital signs: Reviewed and stable  Last Vitals:  Vitals Value Taken Time  BP 131/82 08/26/24 08:03  Temp 36.4 C 08/26/24 08:03  Pulse 97 08/26/24 08:03  Resp 14 08/26/24 08:03  SpO2 97 % 08/26/24 08:03    Last Pain:  Vitals:   08/26/24 0803  TempSrc: Oral  PainSc: 0-No pain      Patients Stated Pain Goal: 9 (08/26/24 9357)  Complications: No notable events documented.

## 2024-08-26 NOTE — Discharge Instructions (Addendum)
  Colonoscopy Discharge Instructions  Read the instructions outlined below and refer to this sheet in the next few weeks. These discharge instructions provide you with general information on caring for yourself after you leave the hospital. Your doctor may also give you specific instructions. While your treatment has been planned according to the most current medical practices available, unavoidable complications occasionally occur.   ACTIVITY You may resume your regular activity, but move at a slower pace for the next 24 hours.  Take frequent rest periods for the next 24 hours.  Walking will help get rid of the air and reduce the bloated feeling in your belly (abdomen).  No driving for 24 hours (because of the medicine (anesthesia) used during the test).   Do not sign any important legal documents or operate any machinery for 24 hours (because of the anesthesia used during the test).  NUTRITION Drink plenty of fluids.  You may resume your normal diet as instructed by your doctor.  Begin with a light meal and progress to your normal diet. Heavy or fried foods are harder to digest and may make you feel sick to your stomach (nauseated).  Avoid alcoholic beverages for 24 hours or as instructed.  MEDICATIONS You may resume your normal medications unless your doctor tells you otherwise.  WHAT YOU CAN EXPECT TODAY Some feelings of bloating in the abdomen.  Passage of more gas than usual.  Spotting of blood in your stool or on the toilet paper.  IF YOU HAD POLYPS REMOVED DURING THE COLONOSCOPY: No aspirin products for 7 days or as instructed.  No alcohol for 7 days or as instructed.  Eat a soft diet for the next 24 hours.  FINDING OUT THE RESULTS OF YOUR TEST Not all test results are available during your visit. If your test results are not back during the visit, make an appointment with your caregiver to find out the results. Do not assume everything is normal if you have not heard from your  caregiver or the medical facility. It is important for you to follow up on all of your test results.  SEEK IMMEDIATE MEDICAL ATTENTION IF: You have more than a spotting of blood in your stool.  Your belly is swollen (abdominal distention).  You are nauseated or vomiting.  You have a temperature over 101.  You have abdominal pain or discomfort that is severe or gets worse throughout the day.   Your colonoscopy revealed 2 polyp(s) which I removed successfully. Await pathology results, my office will contact you. I recommend repeating colonoscopy in 5 years for surveillance purposes.   You also have diverticulosis and internal hemorrhoids. I would recommend increasing fiber in your diet or adding OTC Benefiber/Metamucil. Be sure to drink at least 4 to 6 glasses of water daily. Follow-up with GI as needed.   I hope you have a great rest of your week!  Charles K. Carver, D.O. Gastroenterology and Hepatology [facility]  

## 2024-08-26 NOTE — H&P (Signed)
 Primary Care Physician:  Vinie Allean BIRCH, MD Primary Gastroenterologist:  Dr. Cindie  Pre-Procedure History & Physical: HPI:  Rhonda Gibbs is a 53 y.o. 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 borderline   Osteoporosis    Sleep apnea    has a cpap but does not use   Thyroid disease     Past Surgical History:  Procedure Laterality Date   ABDOMINAL HYSTERECTOMY     partial   BALLOON DILATION N/A 09/17/2021   Procedure: BALLOON DILATION;  Surgeon: Cindie Carlin POUR, DO;  Location: [facility];  Service: Endoscopy;  Laterality: N/A;   BIOPSY  09/17/2021   Procedure: BIOPSY;  Surgeon: Cindie Carlin POUR, DO;  Location: [facility];  Service: Endoscopy;;   CHONDROPLASTY Left 08/03/2015   Procedure: CHONDROPLASTY;  Surgeon: Toribio JULIANNA Chancy, MD;  Location: [facility];  Service: Orthopedics;  Laterality: Left;   CLOSED MANIPULATION KNEE WITH STERIOD INJECTION Left 02/06/2017   Procedure: Closed MANIPULATION of knee with Steroid Injection;  Surgeon: Toribio JULIANNA Chancy, MD;  Location: [facility];  Service: Orthopedics;  Laterality: Left;   COLONOSCOPY  04/04/2020   Dr. San; 5 tubular adenomas in the sigmoid and ascending colon, multiple hyperplastic polyp in the rectum, diverticulosis in the sigmoid colon, nonbleeding internal hemorrhoids.  Repeat in 3 years.   ESOPHAGOGASTRODUODENOSCOPY (EGD) WITH PROPOFOL  N/A 09/17/2021    Procedure: ESOPHAGOGASTRODUODENOSCOPY (EGD) WITH PROPOFOL ;  Surgeon: Cindie Carlin POUR, DO;  Location: [facility];  Service: Endoscopy;  Laterality: N/A;  7:30am   HERNIA REPAIR     umbilical   JOINT REPLACEMENT Left 06/26/2016   Dr Chancy   KNEE ARTHROSCOPY WITH LATERAL RELEASE Left 08/03/2015   Procedure: LEFT KNEE ARTHROSCOPY  CHONDROPLASTY WITH LATERAL RELEASE;  Surgeon: Toribio JULIANNA Chancy, MD;  Location: [facility];  Service: Orthopedics;  Laterality: Left;   TOTAL KNEE ARTHROPLASTY Left 06/26/2016   Procedure: TOTAL KNEE ARTHROPLASTY;  Surgeon: Toribio JULIANNA Chancy, MD;  Location: [facility] OR;  Service: Orthopedics;  Laterality: Left;    Prior to Admission medications   Medication Sig Start Date End Date Taking? Authorizing Provider  atenolol  (TENORMIN ) 25 MG tablet Take 1 tablet (25 mg total) by mouth daily. 05/21/22  Yes Celestia Rosaline SQUIBB, NP  chlorthalidone  (HYGROTON ) 50 MG tablet Take 1 tablet (50 mg total) by mouth daily. 05/21/22  Yes Celestia Rosaline SQUIBB, NP  spironolactone  (ALDACTONE ) 50 MG tablet Take 50 mg by mouth daily.   Yes [provider]    Allergies as of 08/10/2024 - Review Complete 07/01/2024  Allergen Reaction Noted   No known allergies  06/25/2016   Metoprolol Rash 03/01/2010    Family History  Problem Relation Age of Onset   Hypertension Mother    Thyroid disease Mother  Had surgery, unknown diagnosis   Diabetes Father    Hypertension Father    Heart disease Father        chf   Thyroid disease Sister    Hypertension Brother    Stomach cancer Brother    Gastric cancer Brother    Colon cancer Maternal Grandmother    Stomach cancer Maternal Grandmother    Gastric cancer Maternal Grandmother    Cancer Maternal Grandfather        colon   Cancer Paternal Grandmother        cervical or colon????   Cancer Cousin        prostate   Thyroid cancer Neg Hx    Colon polyps Neg Hx    Esophageal cancer Neg Hx     Social History    Socioeconomic History   Marital status: Married    Spouse name: Not on file   Number of children: Not on file   Years of education: Not on file   Highest education level: Not on file  Occupational History   Not on file  Tobacco Use   Smoking status: Never   Smokeless tobacco: Never  Vaping Use   Vaping status: Never Used  Substance and Sexual Activity   Alcohol use: Not Currently   Drug use: No   Sexual activity: Yes    Birth control/protection: Surgical  Other Topics Concern   Not on file  Social History Narrative   Not on file   Social Drivers of Health   Financial Resource Strain: Patient Declined (12/31/2023)   Received from [facility]   Overall Financial Resource Strain (CARDIA)    Difficulty of Paying Living Expenses: Patient declined  Food Insecurity: Patient Declined (12/31/2023)   Received [facility]   Hunger Vital Sign    Within the past 12 months, you worried that your food would run out before you got the money to buy more.: Patient declined    Within the past 12 months, the food you bought just didn't last and you didn't have money to get more.: Patient declined  Transportation Needs: Patient Declined (12/31/2023)   Received from [facility] - Transportation    In the past 12 months, has lack of transportation kept you from medical appointments or from getting medications?: Patient declined    Lack of Transportation (Non-Medical): Patient declined  Physical Activity: Not on file  Stress: Not on file  Social Connections: Unknown (04/04/2022)   [facility] from [facility]   Social Network    Social Network: Not on file  Intimate Partner Violence: Unknown (03/04/2022)   Received from [facility]   HITS    Physically Hurt: Not on file    Insult or Talk Down To: Not on file    Threaten Physical Harm: Not on file    Scream or Curse: Not on file    Review of Systems: See HPI, otherwise  negative ROS  Physical Exam: Vital signs in last 24 hours: Temp:  [98.1 F (36.7 C)] 98.1 F (36.7 C) (09/25 0650) Pulse Rate:  [90] 90 (09/25 0650) Resp:  [15] 15 (09/25 0650) BP: (177)/(102) 177/102 (09/25 0650) SpO2:  [97 %] 97 % (09/25 0650) Weight:  [89.4 kg] 89.4 kg (09/25 0642)   General:   Alert,  Well-developed, well-nourished, pleasant and cooperative in NAD Head:  Normocephalic and atraumatic. Eyes:  Sclera clear, no icterus.   Conjunctiva pink. Ears:  Normal auditory  acuity. Nose:  No deformity, discharge,  or lesions. Msk:  Symmetrical without gross deformities. Normal posture. Extremities:  Without clubbing or edema. Neurologic:  Alert and  oriented x4;  grossly normal neurologically. Skin:  Intact without significant lesions or rashes. Psych:  Alert and cooperative. Normal mood and affect.  Impression/Plan: Rhonda Gibbs is here for a colonoscopy to be performed for surveillance purposes, personal history of adenomatous colon polyps, family history of colon cancer in multiple second degree relatives   The risks of the procedure including infection, bleed, or perforation as well as benefits, limitations, alternatives and imponderables have been reviewed with the patient. Questions have been answered. All parties agreeable.

## 2024-08-27 ENCOUNTER — Encounter (HOSPITAL_COMMUNITY): Payer: Self-pay | Admitting: Internal Medicine

## 2024-08-27 ENCOUNTER — Other Ambulatory Visit: Payer: Self-pay

## 2024-08-27 ENCOUNTER — Emergency Department (HOSPITAL_BASED_OUTPATIENT_CLINIC_OR_DEPARTMENT_OTHER)
Admission: EM | Admit: 2024-08-27 | Discharge: 2024-08-28 | Disposition: A | Discharge status: Home / Self Care | Attending: Emergency Medicine | Admitting: Emergency Medicine

## 2024-08-27 DIAGNOSIS — R519 Headache, unspecified: Secondary | ICD-10-CM | POA: Diagnosis present

## 2024-08-27 DIAGNOSIS — I10 Essential (primary) hypertension: Secondary | ICD-10-CM | POA: Diagnosis not present

## 2024-08-27 NOTE — ED Triage Notes (Signed)
 Pt POV reporting headache behind eyes and neck pain following colonoscopy yesterday. Hx migraines, states this feels different. Also reporting HTN, taking meds as prescribed.

## 2024-08-28 ENCOUNTER — Emergency Department (HOSPITAL_BASED_OUTPATIENT_CLINIC_OR_DEPARTMENT_OTHER)

## 2024-08-28 LAB — COMPREHENSIVE METABOLIC PANEL WITH GFR
ALT: 12 U/L (ref 0–44)
AST: 25 U/L (ref 15–41)
Albumin: 4.9 g/dL (ref 3.5–5.0)
Alkaline Phosphatase: 113 U/L (ref 38–126)
Anion gap: 16 — ABNORMAL HIGH (ref 5–15)
BUN: 17 mg/dL (ref 6–20)
CO2: 27 mmol/L (ref 22–32)
Calcium: 10.9 mg/dL — ABNORMAL HIGH (ref 8.9–10.3)
Chloride: 98 mmol/L (ref 98–111)
Creatinine, Ser: 1.06 mg/dL — ABNORMAL HIGH (ref 0.44–1.00)
GFR, Estimated: >60 mL/min (ref >60)
Glucose, Bld: 93 mg/dL (ref 70–99)
Potassium: 3.7 mmol/L (ref 3.5–5.1)
Sodium: 141 mmol/L (ref 135–145)
Total Bilirubin: 0.3 mg/dL (ref 0.0–1.2)
Total Protein: 8.4 g/dL — ABNORMAL HIGH (ref 6.5–8.1)

## 2024-08-28 LAB — CBC WITH DIFFERENTIAL/PLATELET
Abs Immature Granulocytes: 0.01 K/uL (ref 0.00–0.07)
Basophils Absolute: 0 K/uL (ref 0.0–0.1)
Basophils Relative: 0 %
Eosinophils Absolute: 0.1 K/uL (ref 0.0–0.5)
Eosinophils Relative: 1 %
HCT: 42.8 % (ref 36.0–46.0)
Hemoglobin: 14.2 g/dL (ref 12.0–15.0)
Immature Granulocytes: 0 %
Lymphocytes Relative: 38 %
Lymphs Abs: 2.9 K/uL (ref 0.7–4.0)
MCH: 30.5 pg (ref 26.0–34.0)
MCHC: 33.2 g/dL (ref 30.0–36.0)
MCV: 92 fL (ref 80.0–100.0)
Monocytes Absolute: 0.7 K/uL (ref 0.1–1.0)
Monocytes Relative: 9 %
Neutro Abs: 4 K/uL (ref 1.7–7.7)
Neutrophils Relative %: 52 %
Platelets: 329 K/uL (ref 150–400)
RBC: 4.65 MIL/uL (ref 3.87–5.11)
RDW: 13 % (ref 11.5–15.5)
WBC: 7.7 K/uL (ref 4.0–10.5)
nRBC: 0 % (ref 0.0–0.2)

## 2024-08-28 MED ORDER — SODIUM CHLORIDE 0.9 % IV BOLUS
1000.0000 mL | Freq: Once | INTRAVENOUS | Status: AC
  Administered 2024-08-28: 1000 mL via INTRAVENOUS

## 2024-08-28 MED ORDER — METOCLOPRAMIDE HCL 5 MG/ML IJ SOLN
10.0000 mg | Freq: Once | INTRAMUSCULAR | Status: AC
Start: 2024-08-28 — End: 2024-08-28
  Administered 2024-08-28: 10 mg via INTRAVENOUS
  Filled 2024-08-28: qty 2

## 2024-08-28 MED ORDER — KETOROLAC TROMETHAMINE 30 MG/ML IJ SOLN
30.0000 mg | Freq: Once | INTRAMUSCULAR | Status: AC
  Administered 2024-08-28: 30 mg via INTRAVENOUS
  Filled 2024-08-28: qty 1

## 2024-08-28 NOTE — Discharge Instructions (Signed)
 Take ibuprofen  600 mg rotated with Tylenol  1000 mg every 4 hours as needed for pain.  Return to the emergency department if your symptoms significantly worsen or change.

## 2024-08-28 NOTE — ED Provider Notes (Signed)
 [facility] EMERGENCY DEPARTMENT AT [facility] Provider Note   CSN: [id number] Arrival date & time: 08/27/24  2035     Patient presents with: Headache and Neck Pain   Rhonda Gibbs is a 53 y.o. female.   Patient is a 53 year old female with history of migraines, hypertension, hyperlipidemia, anemia, depression, anxiety, chronic renal insufficiency.  Patient presenting today with complaints of headache.  She reports having a colonoscopy yesterday worsening symptoms began shortly afterwards.  She describes discomfort to the left side of her head radiating into her neck.  She denies any weakness or numbness.  No visual disturbances.  She has not taken anything for these symptoms.       Prior to Admission medications   Medication Sig Start Date End Date Taking? Authorizing Provider  atenolol  (TENORMIN ) 25 MG tablet Take 1 tablet (25 mg total) by mouth daily. 05/21/22   Celestia Rosaline SQUIBB, NP  chlorthalidone  (HYGROTON ) 50 MG tablet Take 1 tablet (50 mg total) by mouth daily. 05/21/22   Celestia Rosaline SQUIBB, NP  spironolactone  (ALDACTONE ) 50 MG tablet Take 50 mg by mouth daily.    [provider]    Allergies: No known allergies and Metoprolol    Review of Systems  All other systems reviewed and are negative.   Updated Vital Signs BP (!) 175/125 (BP Location: Left Arm)   Pulse 81   Temp 98.5 F (36.9 C) (Oral)   Resp 18   Ht 5' 4 (1.626 m)   Wt 89.4 kg   SpO2 98%   BMI 33.81 kg/m   Physical Exam Vitals and nursing note reviewed.  Constitutional:      General: She is not in acute distress.    Appearance: She is well-developed. She is not diaphoretic.  HENT:     Head: Normocephalic and atraumatic.  Cardiovascular:     Rate and Rhythm: Normal rate and regular rhythm.     Heart sounds: No murmur heard.    No friction rub. No gallop.  Pulmonary:     Effort: Pulmonary effort is normal. No respiratory distress.     Breath sounds: Normal breath sounds.  No wheezing.  Abdominal:     General: Bowel sounds are normal. There is no distension.     Palpations: Abdomen is soft.     Tenderness: There is no abdominal tenderness.  Musculoskeletal:        General: Normal range of motion.     Cervical back: Normal range of motion and neck supple.  Skin:    General: Skin is warm and dry.  Neurological:     General: No focal deficit present.     Mental Status: She is alert and oriented to person, place, and time. Mental status is at baseline.     Cranial Nerves: No cranial nerve deficit, dysarthria or facial asymmetry.     Sensory: No sensory deficit.     Motor: No weakness.     (all labs ordered are listed, but only abnormal results are displayed) Labs Reviewed  COMPREHENSIVE METABOLIC PANEL WITH GFR  CBC WITH DIFFERENTIAL/PLATELET    EKG: None  Radiology: No results found.   Procedures   Medications Ordered in the ED  ketorolac  (TORADOL ) 30 MG/ML injection 30 mg (has no administration in time range)  sodium chloride  0.9 % bolus 1,000 mL (has no administration in time range)  metoCLOPramide  (REGLAN ) injection 10 mg (has no administration in time range)  Medical Decision Making Amount and/or Complexity of Data Reviewed Labs: ordered. Radiology: ordered.  Risk Prescription drug management.   Patient is a 53 year old female presenting with headache as described in the HPI.  She arrives here with stable vital signs and is afebrile.  Physical examination is unremarkable.  She is neurologically intact.  Laboratory studies obtained including CBC and CMP, both of which are unremarkable.  CT scan of the head showing no acute process.  Patient has received IV fluids along with Toradol  and Decadron  and seems to be feeling better.  I feel as though discharge is appropriate with as needed return.  There are no red flags in the history, workup, or physical exam that would suggest an emergent  situation.     Final diagnoses:  None    ED Discharge Orders     None          Geroldine Berg, MD 08/28/24 [phone]

## 2024-08-28 NOTE — ED Notes (Signed)
 Patient transported to CT

## 2024-08-31 LAB — SURGICAL PATHOLOGY

## 2024-10-05 ENCOUNTER — Ambulatory Visit: Payer: Self-pay | Admitting: Internal Medicine

## 2024-10-06 NOTE — Progress Notes (Signed)
 done

## 2025-02-01 ENCOUNTER — Institutional Professional Consult (permissible substitution) (INDEPENDENT_AMBULATORY_CARE_PROVIDER_SITE_OTHER): Admitting: Otolaryngology
